# Patient Record
Sex: Male | Born: 1985
Health system: Southern US, Community
[De-identification: ages and names within clinical notes are randomized; demographics above are authoritative.]

## PROBLEM LIST (undated history)

## (undated) DIAGNOSIS — F32A Depression, unspecified: Secondary | ICD-10-CM

## (undated) DIAGNOSIS — J302 Other seasonal allergic rhinitis: Secondary | ICD-10-CM

## (undated) DIAGNOSIS — F329 Major depressive disorder, single episode, unspecified: Secondary | ICD-10-CM

## (undated) DIAGNOSIS — F988 Other specified behavioral and emotional disorders with onset usually occurring in childhood and adolescence: Secondary | ICD-10-CM

## (undated) DIAGNOSIS — M549 Dorsalgia, unspecified: Secondary | ICD-10-CM

## (undated) HISTORY — DX: Other seasonal allergic rhinitis: J30.2

## (undated) HISTORY — DX: Major depressive disorder, single episode, unspecified: F32.9

## (undated) HISTORY — DX: Other specified behavioral and emotional disorders with onset usually occurring in childhood and adolescence: F98.8

## (undated) HISTORY — PX: LACERATION REPAIR: SHX5168

## (undated) HISTORY — PX: OTHER SURGICAL HISTORY: SHX169

## (undated) HISTORY — DX: Depression, unspecified: F32.A

---
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
## 2010-09-04 ENCOUNTER — Emergency Department (HOSPITAL_COMMUNITY)
Admission: EM | Admit: 2010-09-04 | Discharge: 2010-09-04 | Disposition: A | Discharge status: Home / Self Care | Payer: BC Managed Care – PPO | Attending: Emergency Medicine | Admitting: Emergency Medicine

## 2010-09-04 DIAGNOSIS — R45851 Suicidal ideations: Secondary | ICD-10-CM | POA: Insufficient documentation

## 2010-09-04 DIAGNOSIS — F3289 Other specified depressive episodes: Secondary | ICD-10-CM | POA: Insufficient documentation

## 2010-09-04 DIAGNOSIS — F329 Major depressive disorder, single episode, unspecified: Secondary | ICD-10-CM | POA: Insufficient documentation

## 2010-09-04 LAB — COMPREHENSIVE METABOLIC PANEL
ALT: 11 U/L (ref 0–53)
AST: 12 U/L (ref 0–37)
Albumin: 4.1 g/dL (ref 3.5–5.2)
Alkaline Phosphatase: 60 U/L (ref 39–117)
BUN: 10 mg/dL (ref 6–23)
CO2: 26 mEq/L (ref 19–32)
Calcium: 9.2 mg/dL (ref 8.4–10.5)
Chloride: 106 mEq/L (ref 96–112)
Creatinine, Ser: 1 mg/dL (ref 0.50–1.35)
GFR calc Af Amer: >60 mL/min (ref >60)
GFR calc non Af Amer: >60 mL/min (ref >60)
Glucose, Bld: 101 mg/dL — ABNORMAL HIGH (ref 70–99)
Potassium: 3.2 mEq/L — ABNORMAL LOW (ref 3.5–5.1)
Sodium: 141 mEq/L (ref 135–145)
Total Bilirubin: 0.2 mg/dL — ABNORMAL LOW (ref 0.3–1.2)
Total Protein: 6.9 g/dL (ref 6.0–8.3)

## 2010-09-04 LAB — DIFFERENTIAL
Basophils Absolute: 0 10*3/uL (ref 0.0–0.1)
Basophils Relative: 0 % (ref 0–1)
Eosinophils Absolute: 0 10*3/uL (ref 0.0–0.7)
Eosinophils Relative: 0 % (ref 0–5)
Lymphocytes Relative: 15 % (ref 12–46)
Lymphs Abs: 1.9 10*3/uL (ref 0.7–4.0)
Monocytes Absolute: 0.6 10*3/uL (ref 0.1–1.0)
Monocytes Relative: 5 % (ref 3–12)
Neutro Abs: 10.3 10*3/uL — ABNORMAL HIGH (ref 1.7–7.7)
Neutrophils Relative %: 80 % — ABNORMAL HIGH (ref 43–77)

## 2010-09-04 LAB — CBC
HCT: 41.8 % (ref 39.0–52.0)
Hemoglobin: 15 g/dL (ref 13.0–17.0)
MCH: 30.1 pg (ref 26.0–34.0)
MCHC: 35.9 g/dL (ref 30.0–36.0)
MCV: 83.9 fL (ref 78.0–100.0)
Platelets: 253 10*3/uL (ref 150–400)
RBC: 4.98 MIL/uL (ref 4.22–5.81)
RDW: 11.9 % (ref 11.5–15.5)
WBC: 12.7 10*3/uL — ABNORMAL HIGH (ref 4.0–10.5)

## 2010-09-04 LAB — RAPID URINE DRUG SCREEN, HOSP PERFORMED
Barbiturates: NOT DETECTED
Cocaine: NOT DETECTED

## 2010-09-04 LAB — ETHANOL: Alcohol, Ethyl (B): <11 mg/dL (ref 0–11)

## 2010-09-04 LAB — ACETAMINOPHEN LEVEL: Acetaminophen (Tylenol), Serum: <15 ug/mL (ref 10–30)

## 2012-06-03 ENCOUNTER — Emergency Department (HOSPITAL_COMMUNITY)
Admission: EM | Admit: 2012-06-03 | Discharge: 2012-06-03 | Disposition: A | Discharge status: Home / Self Care | Payer: BC Managed Care – PPO | Attending: Emergency Medicine | Admitting: Emergency Medicine

## 2012-06-03 ENCOUNTER — Emergency Department (HOSPITAL_COMMUNITY): Payer: BC Managed Care – PPO

## 2012-06-03 ENCOUNTER — Encounter (HOSPITAL_COMMUNITY): Payer: Self-pay | Admitting: Emergency Medicine

## 2012-06-03 DIAGNOSIS — F17200 Nicotine dependence, unspecified, uncomplicated: Secondary | ICD-10-CM | POA: Insufficient documentation

## 2012-06-03 DIAGNOSIS — S81009A Unspecified open wound, unspecified knee, initial encounter: Secondary | ICD-10-CM | POA: Insufficient documentation

## 2012-06-03 DIAGNOSIS — S81012A Laceration without foreign body, left knee, initial encounter: Secondary | ICD-10-CM

## 2012-06-03 DIAGNOSIS — W010XXA Fall on same level from slipping, tripping and stumbling without subsequent striking against object, initial encounter: Secondary | ICD-10-CM | POA: Insufficient documentation

## 2012-06-03 DIAGNOSIS — Y93H9 Activity, other involving exterior property and land maintenance, building and construction: Secondary | ICD-10-CM | POA: Insufficient documentation

## 2012-06-03 DIAGNOSIS — Y929 Unspecified place or not applicable: Secondary | ICD-10-CM | POA: Insufficient documentation

## 2012-06-03 DIAGNOSIS — S81809A Unspecified open wound, unspecified lower leg, initial encounter: Secondary | ICD-10-CM | POA: Insufficient documentation

## 2012-06-03 MED ORDER — IBUPROFEN 400 MG PO TABS
800.0000 mg | ORAL_TABLET | Freq: Once | ORAL | Status: AC
  Administered 2012-06-03: 800 mg via ORAL
  Filled 2012-06-03: qty 2

## 2012-06-03 NOTE — ED Provider Notes (Signed)
History     CSN: [id number]  Arrival date & time 06/03/12  1326   First MD Initiated Contact with Patient 06/03/12 1330      Chief Complaint  Patient presents with  . Leg Injury    (Consider location/radiation/quality/duration/timing/severity/associated sxs/prior treatment) HPI Comments: Patient reports he was clearing some limbs and tripped/slipped and fell on the sidewalk sustaining a laceration to his left leg below his knee.  Reports "stinging" pain right around the laceration.  No radiation.  He and significant other state they don't know what he cut his leg on, unsure if there was metal or other debris on the sidewalk.  Occurred just prior to arrival.  Denies any weakness or numbness of the leg, difficult bending his knee.  Did not hit head, no LOC.  Tetanus vx is UTD.    The history is provided by the patient.    History reviewed. No pertinent past medical history.  History reviewed. No pertinent past surgical history.  History reviewed. No pertinent family history.  History  Substance Use Topics  . Smoking status: Current Every Day Smoker  . Smokeless tobacco: Not on file  . Alcohol Use: Yes      Review of Systems  Constitutional: Negative for fever.  Skin: Positive for wound. Negative for color change and pallor.  Neurological: Negative for syncope, weakness and numbness.    Allergies  Review of patient's allergies indicates no known allergies.  Home Medications  No current outpatient prescriptions on file.  BP 119/71  Pulse 86  Temp(Src) 97.8 F (36.6 C) (Oral)  SpO2 97%  Physical Exam  Nursing note and vitals reviewed. Constitutional: He appears well-developed and well-nourished. No distress.  HENT:  Head: Normocephalic and atraumatic.  Neck: Neck supple.  Pulmonary/Chest: Effort normal.  Musculoskeletal:       Left knee: He exhibits laceration. He exhibits normal range of motion, no swelling, no effusion, no ecchymosis, no deformity, normal  alignment, no LCL laxity and no MCL laxity. No tenderness found.       Legs: Neurological: He is alert.  Skin: He is not diaphoretic.    ED Course  Procedures (including critical care time)  Labs Reviewed - No data to display Dg Knee 2 Views Left  06/03/2012  *RADIOLOGY REPORT*  Clinical Data: Laceration post fall.  LEFT KNEE - 1-2 VIEW  Comparison: None.  Findings: No radiodense foreign body.  No effusion. Negative for fracture, dislocation, or other acute abnormality.  Normal alignment and mineralization. No significant degenerative change. Regional soft tissues unremarkable.  IMPRESSION:  Negative   Original Report Authenticated By: D. Andria Rhein, MD     LACERATION REPAIR Performed by: Rise Patience Authorized by: Rise Patience Consent: Verbal consent obtained. Risks and benefits: risks, benefits and alternatives were discussed Consent given by: patient Patient identity confirmed: provided demographic data Prepped and Draped in normal sterile fashion Wound explored  Laceration Location: left knee  Laceration Length: 3cm  No Foreign Bodies seen or palpated  Anesthesia: local infiltration  Local anesthetic: lidocaine 2% no epinephrine  Anesthetic total: 8 ml  Irrigation method: syringe Amount of cleaning: standard  Skin closure: 4-0 prolene  Number of sutures: 6  Technique: simple interrupted  Patient tolerance: Patient tolerated the procedure well with no immediate complications.    1. Laceration of left knee, initial encounter       MDM  Pt with mechanical fall onto concrete sustaining laceration to left knee.  Knee joint uninvolved.  Xray to  r/o FB.  Laceration repaired in ED.  Discussed all results, care plan, and follow up with patient.  Pt given return precautions.  Pt verbalizes understanding and agrees with plan.           Trixie Dredge, PA-C 06/03/12 1455

## 2012-06-03 NOTE — ED Notes (Signed)
Slipped on concrete

## 2012-06-03 NOTE — ED Provider Notes (Signed)
Medical screening examination/treatment/procedure(s) were performed by non-physician practitioner and as supervising physician I was immediately available for consultation/collaboration.  Luwanna Brossman, MD 06/03/12 1645 

## 2012-08-29 ENCOUNTER — Encounter (HOSPITAL_COMMUNITY): Payer: Self-pay | Admitting: *Deleted

## 2012-08-29 ENCOUNTER — Emergency Department (HOSPITAL_COMMUNITY)
Admission: EM | Admit: 2012-08-29 | Discharge: 2012-08-29 | Disposition: A | Discharge status: Home / Self Care | Payer: BC Managed Care – PPO | Attending: Emergency Medicine | Admitting: Emergency Medicine

## 2012-08-29 DIAGNOSIS — S39012A Strain of muscle, fascia and tendon of lower back, initial encounter: Secondary | ICD-10-CM

## 2012-08-29 DIAGNOSIS — Y929 Unspecified place or not applicable: Secondary | ICD-10-CM | POA: Insufficient documentation

## 2012-08-29 DIAGNOSIS — X58XXXA Exposure to other specified factors, initial encounter: Secondary | ICD-10-CM | POA: Insufficient documentation

## 2012-08-29 DIAGNOSIS — S335XXA Sprain of ligaments of lumbar spine, initial encounter: Secondary | ICD-10-CM | POA: Insufficient documentation

## 2012-08-29 DIAGNOSIS — F17200 Nicotine dependence, unspecified, uncomplicated: Secondary | ICD-10-CM | POA: Insufficient documentation

## 2012-08-29 DIAGNOSIS — Y939 Activity, unspecified: Secondary | ICD-10-CM | POA: Insufficient documentation

## 2012-08-29 MED ORDER — NAPROXEN 375 MG PO TABS
375.0000 mg | ORAL_TABLET | Freq: Two times a day (BID) | ORAL | Status: DC
Start: 2012-08-29 — End: 2013-03-30

## 2012-08-29 MED ORDER — METAXALONE 800 MG PO TABS: 800.0000 mg | ORAL_TABLET | Freq: Three times a day (TID) | ORAL | Status: DC

## 2012-08-29 NOTE — ED Provider Notes (Signed)
   History    CSN: [id number] Arrival date & time 08/29/12  0719  First MD Initiated Contact with Patient 08/29/12 [phone]     Chief Complaint  Patient presents with  . Back Pain   (Consider location/radiation/quality/duration/timing/severity/associated sxs/prior Treatment) HPI Comments: Pt present with back pain that started about 2 weeks ago.  He states that it is constant and throbbing and has just not been getting better since than.  It is across his lower back.  No abd pain, no radiation down legs.  No numbness or weakness to his legs.  No bowel or bladder incontinence.  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  Denies any particular injury that started the pain.  Has been using tylenol at home without improvement.  Tried a friend's flexeril without improvement.  Patient is a 27 y.o. male presenting with back pain.  Back Pain Associated symptoms: no abdominal pain, no dysuria, no fever, no headaches, no numbness and no weakness    History reviewed. No pertinent past medical history. History reviewed. No pertinent past surgical history. No family history on file. History  Substance Use Topics  . Smoking status: Current Every Day Smoker  . Smokeless tobacco: Not on file  . Alcohol Use: Yes    Review of Systems  Constitutional: Negative for fever.  HENT: Negative for neck pain.   Respiratory: Negative.   Cardiovascular: Negative.   Gastrointestinal: Negative for nausea, vomiting and abdominal pain.  Genitourinary: Negative.  Negative for dysuria.  Musculoskeletal: Positive for back pain. Negative for joint swelling.  Skin: Negative for wound.  Neurological: Negative for weakness, numbness and headaches.    Allergies  Review of patient's allergies indicates no known allergies.  Home Medications   Current Outpatient Rx  Name  Route  Sig  Dispense  Refill  . metaxalone (SKELAXIN) 800 MG tablet   Oral   Take 1 tablet  (800 mg total) by mouth 3 (three) times daily.   21 tablet   0   . naproxen (NAPROSYN) 375 MG tablet   Oral   Take 1 tablet (375 mg total) by mouth 2 (two) times daily.   20 tablet   0    BP 130/83  Pulse 65  Temp(Src) 98.1 F (36.7 C) (Oral)  Resp 20  SpO2 98% Physical Exam  Constitutional: He is oriented to person, place, and time. He appears well-developed and well-nourished.  HENT:  Head: Normocephalic and atraumatic.  Neck: Normal range of motion. Neck supple.  No pain to neck or back  Musculoskeletal: He exhibits no edema and no tenderness.  +tenderness across the lumbar paraspinal muscles bilaterally.  No midline tenderness.  Neg SLR bilaterally.  Patellar reflexes symmetric bilaterally.  Neurological: He is alert and oriented to person, place, and time. He has normal strength. No sensory deficit.  Skin: Skin is warm and dry.    ED Course  Procedures (including critical care time) Labs Reviewed - No data to display No results found. 1. Back strain, initial encounter     MDM  Will try naproxen, skelaxin.  Feel that this is likely musculoskeletal.  No indication for imaging now.  No signs of cauda equina.  Will d/c.  Advised to f/u with his PMD in [place] if symptoms not improving.  Rolan Bucco, MD 08/29/12 [phone]

## 2012-08-29 NOTE — ED Notes (Signed)
Pt is here with lower back pain for 1.5 weeks and thinks he injured it at work.

## 2012-08-29 NOTE — Discharge Instructions (Signed)
Back Pain, Adult Low back pain is very common. About 1 in 5 people have back pain.The cause of low back pain is rarely dangerous. The pain often gets better over time.About half of people with a sudden onset of back pain feel better in just 2 weeks. About 8 in 10 people feel better by 6 weeks.  CAUSES Some common causes of back pain include:  Strain of the muscles or ligaments supporting the spine.  Wear and tear (degeneration) of the spinal discs.  Arthritis.  Direct injury to the back. DIAGNOSIS Most of the time, the direct cause of low back pain is not known.However, back pain can be treated effectively even when the exact cause of the pain is unknown.Answering your caregiver's questions about your overall health and symptoms is one of the most accurate ways to make sure the cause of your pain is not dangerous. If your caregiver needs more information, he or she may order lab work or imaging tests (X-rays or MRIs).However, even if imaging tests show changes in your back, this usually does not require surgery. HOME CARE INSTRUCTIONS For many people, back pain returns.Since low back pain is rarely dangerous, it is often a condition that people can learn to [facility] their own.   Remain active. It is stressful on the back to sit or stand in one place. Do not sit, drive, or stand in one place for more than 30 minutes at a time. Take short walks on level surfaces as soon as pain allows.Try to increase the length of time you walk each day.  Do not stay in bed.Resting more than 1 or 2 days can delay your recovery.  Do not avoid exercise or work.Your body is made to move.It is not dangerous to be active, even though your back may hurt.Your back will likely heal faster if you return to being active before your pain is gone.  Pay attention to your body when you bend and lift. Many people have less discomfortwhen lifting if they bend their knees, keep the load close to their bodies,and  avoid twisting. Often, the most comfortable positions are those that put less stress on your recovering back.  Find a comfortable position to sleep. Use a firm mattress and lie on your side with your knees slightly bent. If you lie on your back, put a pillow under your knees.  Only take over-the-counter or prescription medicines as directed by your caregiver. Over-the-counter medicines to reduce pain and inflammation are often the most helpful.Your caregiver may prescribe muscle relaxant drugs.These medicines help dull your pain so you can more quickly return to your normal activities and healthy exercise.  Put ice on the injured area.  Put ice in a plastic bag.  Place a towel between your skin and the bag.  Leave the ice on for 15-20 minutes, 3-4 times a day for the first 2 to 3 days. After that, ice and heat may be alternated to reduce pain and spasms.  Ask your caregiver about trying back exercises and gentle massage. This may be of some benefit.  Avoid feeling anxious or stressed.Stress increases muscle tension and can worsen back pain.It is important to recognize when you are anxious or stressed and learn ways to manage it.Exercise is a great option. SEEK MEDICAL CARE IF:  You have pain that is not relieved with rest or medicine.  You have pain that does not improve in 1 week.  You have new symptoms.  You are generally not feeling well. SEEK  IMMEDIATE MEDICAL CARE IF:   You have pain that radiates from your back into your legs.  You develop new bowel or bladder control problems.  You have unusual weakness or numbness in your arms or legs.  You develop nausea or vomiting.  You develop abdominal pain.  You feel faint. Document Released: 02/15/2005 Document Revised: 08/17/2011 Document Reviewed: 07/06/2010 [facility] Patient Information 2014 [place], [place].  Cryotherapy Cryotherapy means treatment with cold. Ice or gel packs can be used to reduce both pain and  swelling. Ice is the most helpful within the first 24 to 48 hours after an injury or flareup from overusing a muscle or joint. Sprains, strains, spasms, burning pain, shooting pain, and aches can all be eased with ice. Ice can also be used when recovering from surgery. Ice is effective, has very few side effects, and is safe for most people to use. PRECAUTIONS  Ice is not a safe treatment option for people with:  Raynaud's phenomenon. This is a condition affecting small blood vessels in the extremities. Exposure to cold may cause your problems to return.  Cold hypersensitivity. There are many forms of cold hypersensitivity, including:  Cold urticaria. Red, itchy hives appear on the skin when the tissues begin to warm after being iced.  Cold erythema. This is a red, itchy rash caused by exposure to cold.  Cold hemoglobinuria. Red blood cells break down when the tissues begin to warm after being iced. The hemoglobin that carry oxygen are passed into the urine because they cannot combine with blood proteins fast enough.  Numbness or altered sensitivity in the area being iced. If you have any of the following conditions, do not use ice until you have discussed cryotherapy with your caregiver:  Heart conditions, such as arrhythmia, angina, or chronic heart disease.  High blood pressure.  Healing wounds or open skin in the area being iced.  Current infections.  Rheumatoid arthritis.  Poor circulation.  Diabetes. Ice slows the blood flow in the region it is applied. This is beneficial when trying to stop inflamed tissues from spreading irritating chemicals to surrounding tissues. However, if you expose your skin to cold temperatures for too long or without the proper protection, you can damage your skin or nerves. Watch for signs of skin damage due to cold. HOME CARE INSTRUCTIONS Follow these tips to use ice and cold packs safely.  Place a dry or damp towel between the ice and skin. A damp  towel will cool the skin more quickly, so you may need to shorten the time that the ice is used.  For a more rapid response, add gentle compression to the ice.  Ice for no more than 10 to 20 minutes at a time. The bonier the area you are icing, the less time it will take to get the benefits of ice.  Check your skin after 5 minutes to make sure there are no signs of a poor response to cold or skin damage.  Rest 20 minutes or more in between uses.  Once your skin is numb, you can end your treatment. You can test numbness by very lightly touching your skin. The touch should be so light that you do not see the skin dimple from the pressure of your fingertip. When using ice, most people will feel these normal sensations in this order: cold, burning, aching, and numbness.  Do not use ice on someone who cannot communicate their responses to pain, such as small children or people with dementia. HOW  MAKE AN ICE PACK °Ice packs are the most common way to use ice therapy. Other methods include ice massage, ice baths, and cryo-sprays. Muscle creams that cause a cold, tingly feeling do not offer the same benefits that ice offers and should not be used as a substitute unless recommended by your caregiver. °To make an ice pack, do one of the following: °· Place crushed ice or a bag of frozen vegetables in a sealable plastic bag. Squeeze out the excess air. Place this bag inside another plastic bag. Slide the bag into a pillowcase or place a damp towel between your skin and the bag. °· Mix 3 parts water with 1 part rubbing alcohol. Freeze the mixture in a sealable plastic bag. When you remove the mixture from the freezer, it will be slushy. Squeeze out the excess air. Place this bag inside another plastic bag. Slide the bag into a pillowcase or place a damp towel between your skin and the bag. °SEEK MEDICAL CARE IF: °· You develop white spots on your skin. This may give the skin a blotchy (mottled)  appearance. °· Your skin turns blue or pale. °· Your skin becomes waxy or hard. °· Your swelling gets worse. °MAKE SURE YOU:  °· Understand these instructions. °· Will watch your condition. °· Will get help right away if you are not doing well or get worse. °Document Released: 10/12/2010 Document Revised: 05/10/2011 Document Reviewed: 10/12/2010 °[employer]® Patient Information ©2014 [employer]. ° °

## 2013-01-22 ENCOUNTER — Encounter (HOSPITAL_COMMUNITY): Payer: Self-pay | Admitting: Family Medicine

## 2013-01-22 ENCOUNTER — Emergency Department (INDEPENDENT_AMBULATORY_CARE_PROVIDER_SITE_OTHER)
Admission: EM | Admit: 2013-01-22 | Discharge: 2013-01-22 | Disposition: A | Discharge status: Home / Self Care | Payer: BC Managed Care – PPO | Source: Home / Self Care

## 2013-01-22 DIAGNOSIS — M549 Dorsalgia, unspecified: Secondary | ICD-10-CM

## 2013-01-22 DIAGNOSIS — M62838 Other muscle spasm: Secondary | ICD-10-CM

## 2013-01-22 HISTORY — DX: Dorsalgia, unspecified: M54.9

## 2013-01-22 MED ORDER — MELOXICAM 15 MG PO TABS: 15.0000 mg | ORAL_TABLET | Freq: Every day | ORAL | Status: DC

## 2013-01-22 MED ORDER — TRAMADOL HCL 50 MG PO TABS: 50.0000 mg | ORAL_TABLET | Freq: Four times a day (QID) | ORAL | Status: DC | PRN

## 2013-01-22 MED ORDER — CYCLOBENZAPRINE HCL 5 MG PO TABS: 5.0000 mg | ORAL_TABLET | Freq: Three times a day (TID) | ORAL | Status: DC | PRN

## 2013-01-22 NOTE — ED Notes (Signed)
Being treated with spouse , spouse is a patient, too

## 2013-01-22 NOTE — ED Notes (Signed)
Back pain, right worse than left side of back.  , onset yesterday, history of chronic back pain.  No recent injury.  No leg pain, patient has been moving recently with a lot of lifting and patient does tree work for a living.

## 2013-01-22 NOTE — ED Provider Notes (Signed)
CSN: [id number]     Arrival date & time 01/22/13  1535 History   None    Chief Complaint  Patient presents with  . Back Pain   (Consider location/radiation/quality/duration/timing/severity/associated sxs/prior Treatment) HPI  Back pain: started yesterday. Comes and goes. Lots of heavy lifting at work. Recent move w/ lots of heavy furniture. Muscles hurt. Pulsatile. Non-radiating. Worse w/ certain movements. Advil 800mg  w/ some benefit. Denies any loss of bowel or bladder function, loss of LE strength, falls, trauma to lower back.   Past Medical History  Diagnosis Date  . Back pain    History reviewed. No pertinent past surgical history. No family history on file. History  Substance Use Topics  . Smoking status: Current Every Day Smoker  . Smokeless tobacco: Not on file  . Alcohol Use: Yes    Review of Systems  Respiratory: Negative for chest tightness and shortness of breath.   Cardiovascular: Negative for chest pain.  All other systems reviewed and are negative.    Allergies  Review of patient's allergies indicates no known allergies.  Home Medications   Current Outpatient Rx  Name  Route  Sig  Dispense  Refill  . metaxalone (SKELAXIN) 800 MG tablet   Oral   Take 1 tablet (800 mg total) by mouth 3 (three) times daily.   21 tablet   0   . naproxen (NAPROSYN) 375 MG tablet   Oral   Take 1 tablet (375 mg total) by mouth 2 (two) times daily.   20 tablet   0    BP 122/75  Pulse 81  Temp(Src) 98.3 F (36.8 C) (Oral)  Resp 20  SpO2 97% Physical Exam  Constitutional: He is oriented to person, place, and time. He appears well-developed and well-nourished. No distress.  HENT:  Head: Normocephalic and atraumatic.  Neck: Normal range of motion.  Cardiovascular: Normal rate, normal heart sounds and intact distal pulses.   Pulmonary/Chest: Effort normal and breath sounds normal.  Abdominal: Soft. He exhibits no distension.  Musculoskeletal:  R lower thoracic to  lumbar perispinal muscle tenderness and tightness. No bony abnromality. FROM.   Neurological: He is alert and oriented to person, place, and time.  Skin: Skin is warm and dry.  Psychiatric: He has a normal mood and affect. His behavior is normal. Judgment and thought content normal.    ED Course  Procedures (including critical care time) Labs Review Labs Reviewed - No data to display Imaging Review No results found.  EKG Interpretation    Date/Time:    Ventricular Rate:    PR Interval:    QRS Duration:   QT Interval:    QTC Calculation:   R Axis:     Text Interpretation:              MDM   1. Back pain   2. Muscle spasm    27yo M w/ back spasm and pain.  - Tramadol, flexeril, meloxicam - light work duty for 1 week - exercises, rest, massage, heat - precautions given and all questions answered  Shelly Flatten, MD Family Medicine PGY-3 01/22/2013, 4:38 PM     Ozella Rocks, MD 01/22/13 [phone]

## 2013-03-30 ENCOUNTER — Ambulatory Visit (INDEPENDENT_AMBULATORY_CARE_PROVIDER_SITE_OTHER): Payer: BC Managed Care – PPO | Admitting: Pulmonary Disease

## 2013-03-30 ENCOUNTER — Encounter: Payer: Self-pay | Admitting: Pulmonary Disease

## 2013-03-30 VITALS — BP 142/82 | HR 93 | Temp 98.5°F | Ht 67.0 in | Wt 189.2 lb

## 2013-03-30 DIAGNOSIS — R042 Hemoptysis: Secondary | ICD-10-CM | POA: Insufficient documentation

## 2013-03-30 NOTE — Progress Notes (Signed)
   Subjective:    Patient ID: Matthew Leblanc, male    DOB: 10/07/1985, 28 y.o.   MRN: [id number]  HPI The patient is a 28 year old male who I've been asked to see for hemoptysis. He has a long history of smoking, but was in his usual state of health until approximately 4 days ago. He noticed upon arising mucus that contained about a tablespoon of maroon-colored blood, as well as green mucus. This occurred daily in the mornings, and would sometimes be more red in color. This tapered off, and he is bringing up only "specks" at this time. The patient is unsure if this was coming from his chest or the back of his throat, but he states that he feels his palate and back of throat is coated with this abnormal mucus. He has not had any epistaxis, but does have nasal congestion and pressure. He denies any chest congestion, and doesn't feel like he has a cold. He has had a recent chest x-ray with no acute process noted.   Review of Systems  Constitutional: Negative for fever and unexpected weight change.  HENT: Negative for congestion, dental problem, ear pain, nosebleeds, postnasal drip, rhinorrhea, sinus pressure, sneezing, sore throat and trouble swallowing.   Eyes: Negative for redness and itching.  Respiratory: Positive for cough. Negative for chest tightness, shortness of breath and wheezing.        Hemoptysis  Cardiovascular: Positive for chest pain. Negative for palpitations and leg swelling.       Aching chest pain radiates to back  Gastrointestinal: Negative for nausea and vomiting.  Genitourinary: Negative for dysuria.  Musculoskeletal: Negative for joint swelling.  Skin: Negative for rash.  Neurological: Negative for headaches.  Hematological: Does not bruise/bleed easily.  Psychiatric/Behavioral: Negative for dysphoric mood. The patient is not nervous/anxious.        Objective:   Physical Exam Constitutional:  Well developed, no acute distress  HENT:  Nares patent without  discharge,mild inflammatory changes.   Oropharynx without exudate, palate and uvula are normal  Eyes:  Perrla, eomi, no scleral icterus  Neck:  No JVD, no TMG  Cardiovascular:  Normal rate, regular rhythm, no rubs or gallops.  No murmurs        Intact distal pulses  Pulmonary :  Normal breath sounds, no stridor or respiratory distress   No rales, rhonchi, or wheezing  Abdominal:  Soft, nondistended, bowel sounds present.  No tenderness noted.   Musculoskeletal:  No lower extremity edema noted.  Lymph Nodes:  No cervical lymphadenopathy noted  Skin:  No cyanosis noted  Neurologic:  Alert, appropriate, moves all 4 extremities without obvious deficit.         Assessment & Plan:

## 2013-03-30 NOTE — Assessment & Plan Note (Signed)
The patient has a history of hemoptysis with onset 4 days prior, and he also describes purulent mucus admixed with the blood. From his history, it sounds like this is coming more from his upper airway than lower. I suspect this is related to an acute sinusitis, and I have asked him to take his Augmentin as prescribed by his primary physician. If he continues to have hemoptysis, I am happy to reevaluate. Also stressed to him the importance of total smoking cessation.

## 2013-03-30 NOTE — Patient Instructions (Signed)
The most common cause of blood in mucus in your age group is a sinus infection and bronchitis.   Fill your prescription for augmentin, and take as directed.   Use nasal saline spray am and pm to clean out nose well while taking antibiotic. Stop smoking. If you do not have complete resolution of your symptoms, you need to let us know.

## 2013-05-29 ENCOUNTER — Encounter (HOSPITAL_COMMUNITY): Payer: Self-pay | Admitting: Emergency Medicine

## 2013-05-29 ENCOUNTER — Emergency Department (HOSPITAL_COMMUNITY)
Admission: EM | Admit: 2013-05-29 | Discharge: 2013-05-29 | Discharge status: Against Medical Advice | Payer: BC Managed Care – PPO | Attending: Emergency Medicine | Admitting: Emergency Medicine

## 2013-05-29 DIAGNOSIS — R197 Diarrhea, unspecified: Secondary | ICD-10-CM | POA: Insufficient documentation

## 2013-05-29 LAB — COMPREHENSIVE METABOLIC PANEL
ALBUMIN: 4.9 g/dL (ref 3.5–5.2)
ALT: 30 U/L (ref 0–53)
AST: 25 U/L (ref 0–37)
Alkaline Phosphatase: 109 U/L (ref 39–117)
BUN: 15 mg/dL (ref 6–23)
CALCIUM: 10 mg/dL (ref 8.4–10.5)
CO2: 22 mEq/L (ref 19–32)
Chloride: 99 mEq/L (ref 96–112)
Creatinine, Ser: 1.36 mg/dL — ABNORMAL HIGH (ref 0.50–1.35)
GFR calc non Af Amer: 71 mL/min — ABNORMAL LOW (ref >90)
GFR, EST AFRICAN AMERICAN: 83 mL/min — AB (ref >90)
Glucose, Bld: 96 mg/dL (ref 70–99)
Potassium: 4.1 mEq/L (ref 3.7–5.3)
SODIUM: 140 meq/L (ref 137–147)
TOTAL PROTEIN: 8.7 g/dL — AB (ref 6.0–8.3)
Total Bilirubin: 0.4 mg/dL (ref 0.3–1.2)

## 2013-05-29 LAB — CBC WITH DIFFERENTIAL/PLATELET
BASOS ABS: 0 10*3/uL (ref 0.0–0.1)
BASOS PCT: 0 % (ref 0–1)
EOS ABS: 0.3 10*3/uL (ref 0.0–0.7)
EOS PCT: 2 % (ref 0–5)
HCT: 48.7 % (ref 39.0–52.0)
Hemoglobin: 18 g/dL — ABNORMAL HIGH (ref 13.0–17.0)
Lymphocytes Relative: 7 % — ABNORMAL LOW (ref 12–46)
Lymphs Abs: 1.1 10*3/uL (ref 0.7–4.0)
MCH: 30.2 pg (ref 26.0–34.0)
MCHC: 37 g/dL — ABNORMAL HIGH (ref 30.0–36.0)
MCV: 81.6 fL (ref 78.0–100.0)
Monocytes Absolute: 1.2 10*3/uL — ABNORMAL HIGH (ref 0.1–1.0)
Monocytes Relative: 8 % (ref 3–12)
Neutro Abs: 11.9 10*3/uL — ABNORMAL HIGH (ref 1.7–7.7)
Neutrophils Relative %: 82 % — ABNORMAL HIGH (ref 43–77)
PLATELETS: 270 10*3/uL (ref 150–400)
RBC: 5.97 MIL/uL — ABNORMAL HIGH (ref 4.22–5.81)
RDW: 12.5 % (ref 11.5–15.5)
WBC: 14.5 10*3/uL — ABNORMAL HIGH (ref 4.0–10.5)

## 2013-05-29 LAB — LIPASE, BLOOD: Lipase: 17 U/L (ref 11–59)

## 2013-05-29 NOTE — ED Notes (Signed)
Pt presents to department for evaluation of diarrhea. Onset yesterday. No relief with medications at home. States several runny bowel movements today. Denies abdominal pain. No signs of distress noted.

## 2013-05-29 NOTE — ED Notes (Signed)
Pt sts that he is leaving so he can go sit with his girlfriend that is in [employer].

## 2013-12-11 ENCOUNTER — Emergency Department (HOSPITAL_COMMUNITY)
Admission: EM | Admit: 2013-12-11 | Discharge: 2013-12-11 | Disposition: A | Discharge status: Home / Self Care | Payer: BC Managed Care – PPO | Attending: Emergency Medicine | Admitting: Emergency Medicine

## 2013-12-11 ENCOUNTER — Encounter (HOSPITAL_COMMUNITY): Payer: Self-pay | Admitting: Emergency Medicine

## 2013-12-11 DIAGNOSIS — R112 Nausea with vomiting, unspecified: Secondary | ICD-10-CM | POA: Diagnosis not present

## 2013-12-11 DIAGNOSIS — R1084 Generalized abdominal pain: Secondary | ICD-10-CM | POA: Insufficient documentation

## 2013-12-11 DIAGNOSIS — R197 Diarrhea, unspecified: Secondary | ICD-10-CM | POA: Insufficient documentation

## 2013-12-11 DIAGNOSIS — Z720 Tobacco use: Secondary | ICD-10-CM | POA: Insufficient documentation

## 2013-12-11 DIAGNOSIS — F329 Major depressive disorder, single episode, unspecified: Secondary | ICD-10-CM | POA: Insufficient documentation

## 2013-12-11 DIAGNOSIS — R109 Unspecified abdominal pain: Secondary | ICD-10-CM | POA: Diagnosis present

## 2013-12-11 DIAGNOSIS — Z8709 Personal history of other diseases of the respiratory system: Secondary | ICD-10-CM | POA: Insufficient documentation

## 2013-12-11 DIAGNOSIS — Z8739 Personal history of other diseases of the musculoskeletal system and connective tissue: Secondary | ICD-10-CM | POA: Diagnosis not present

## 2013-12-11 DIAGNOSIS — Z79899 Other long term (current) drug therapy: Secondary | ICD-10-CM | POA: Diagnosis not present

## 2013-12-11 LAB — LIPASE, BLOOD: Lipase: 17 U/L (ref 11–59)

## 2013-12-11 LAB — CBC WITH DIFFERENTIAL/PLATELET
Basophils Absolute: 0 10*3/uL (ref 0.0–0.1)
Basophils Relative: 0 % (ref 0–1)
EOS ABS: 0.7 10*3/uL (ref 0.0–0.7)
Eosinophils Relative: 5 % (ref 0–5)
HCT: 47.8 % (ref 39.0–52.0)
HEMOGLOBIN: 16.8 g/dL (ref 13.0–17.0)
LYMPHS ABS: 3.1 10*3/uL (ref 0.7–4.0)
Lymphocytes Relative: 20 % (ref 12–46)
MCH: 29.3 pg (ref 26.0–34.0)
MCHC: 35.1 g/dL (ref 30.0–36.0)
MCV: 83.4 fL (ref 78.0–100.0)
MONOS PCT: 9 % (ref 3–12)
Monocytes Absolute: 1.4 10*3/uL — ABNORMAL HIGH (ref 0.1–1.0)
NEUTROS PCT: 66 % (ref 43–77)
Neutro Abs: 10.5 10*3/uL — ABNORMAL HIGH (ref 1.7–7.7)
Platelets: 285 10*3/uL (ref 150–400)
RBC: 5.73 MIL/uL (ref 4.22–5.81)
RDW: 12.5 % (ref 11.5–15.5)
WBC: 15.7 10*3/uL — ABNORMAL HIGH (ref 4.0–10.5)

## 2013-12-11 LAB — URINALYSIS, ROUTINE W REFLEX MICROSCOPIC
BILIRUBIN URINE: NEGATIVE
Glucose, UA: NEGATIVE mg/dL
HGB URINE DIPSTICK: NEGATIVE
KETONES UR: NEGATIVE mg/dL
Leukocytes, UA: NEGATIVE
Nitrite: NEGATIVE
PROTEIN: NEGATIVE mg/dL
Specific Gravity, Urine: 1.041 — ABNORMAL HIGH (ref 1.005–1.030)
UROBILINOGEN UA: 0.2 mg/dL (ref 0.0–1.0)
pH: 5 (ref 5.0–8.0)

## 2013-12-11 LAB — COMPREHENSIVE METABOLIC PANEL
ALK PHOS: 90 U/L (ref 39–117)
ALT: 24 U/L (ref 0–53)
ANION GAP: 15 (ref 5–15)
AST: 22 U/L (ref 0–37)
Albumin: 4.4 g/dL (ref 3.5–5.2)
BILIRUBIN TOTAL: 0.4 mg/dL (ref 0.3–1.2)
BUN: 15 mg/dL (ref 6–23)
CO2: 20 mEq/L (ref 19–32)
Calcium: 9.6 mg/dL (ref 8.4–10.5)
Chloride: 104 mEq/L (ref 96–112)
Creatinine, Ser: 1.17 mg/dL (ref 0.50–1.35)
GFR, EST NON AFRICAN AMERICAN: 85 mL/min — AB (ref >90)
GLUCOSE: 113 mg/dL — AB (ref 70–99)
POTASSIUM: 4.1 meq/L (ref 3.7–5.3)
Sodium: 139 mEq/L (ref 137–147)
TOTAL PROTEIN: 7.7 g/dL (ref 6.0–8.3)

## 2013-12-11 MED ORDER — ONDANSETRON 4 MG PO TBDP
4.0000 mg | ORAL_TABLET | Freq: Once | ORAL | Status: AC
  Administered 2013-12-11: 4 mg via ORAL
  Filled 2013-12-11: qty 1

## 2013-12-11 MED ORDER — PROMETHAZINE HCL 25 MG PO TABS: 25.0000 mg | ORAL_TABLET | Freq: Four times a day (QID) | ORAL | Status: AC | PRN

## 2013-12-11 NOTE — ED Provider Notes (Signed)
CSN: [id number]     Arrival date & time 12/11/13  0444 History   First MD Initiated Contact with Patient 12/11/13 [phone]     Chief Complaint  Patient presents with  . Abdominal Pain     (Consider location/radiation/quality/duration/timing/severity/associated sxs/prior Treatment) HPI Pt is a 27yo male presenting to ED with c/o diffuse abdominal cramping, n/v/d that started yesterday morning. Reports abdominal pain is intermittent, cramping in nature, 8/10 at worst, 2/10 at this time. Reports 3 episodes of vomiting since symptoms started yesterday, and 4-5 episodes of diarrhea per hour, denies blood or mucous in stool.  States symptoms started around 7AM yesterday, seemed to resolve by 4PM so he had buffalo chicken wings, symptoms woke him up again early this morning.  States he has a pregnant wife and young daughter at home he does not want to get sick. No known sick contacts or recent travel. No medications taken PTA. Denies fever or chills. No urinary symptoms. No hx of abdominal surgeries.   Past Medical History  Diagnosis Date  . Back pain   . Depression   . ADD (attention deficit disorder) without hyperactivity   . Seasonal allergies    Past Surgical History  Procedure Laterality Date  . Chin surgery      baseball accident  . Laceration repair      left knee   Family History  Problem Relation Age of Onset  . Heart disease Maternal Grandmother   . Brain cancer Paternal Grandfather   . Hyperlipidemia Maternal Grandmother   . Hypertension Mother    History  Substance Use Topics  . Smoking status: Current Every Day Smoker -- 1.00 packs/day for 8 years    Types: Cigarettes  . Smokeless tobacco: Not on file     Comment: Pt is cutting back--currently smoking 1/4 PPD  . Alcohol Use: Yes     Comment: social-rare    Review of Systems  Constitutional: Positive for appetite change. Negative for fever and chills.  Respiratory: Negative for cough and shortness of breath.    Cardiovascular: Negative for chest pain and palpitations.  Gastrointestinal: Positive for nausea, vomiting, abdominal pain ( cramping, discomfort) and diarrhea. Negative for blood in stool.  Genitourinary: Negative for dysuria, frequency, hematuria and flank pain.  Musculoskeletal: Negative for back pain and myalgias.  All other systems reviewed and are negative.     Allergies  Review of patient's allergies indicates no known allergies.  Home Medications   Prior to Admission medications   Medication Sig Start Date End Date Taking? Authorizing Provider  ALPRAZolam Prudy Feeler(XANAX) 1 MG tablet Take 1 mg by mouth once.   Yes Historical Provider, MD  promethazine (PHENERGAN) 25 MG tablet Take 1 tablet (25 mg total) by mouth every 6 (six) hours as needed for nausea or vomiting. 12/11/13   Junius FinnerErin O'Malley, PA-C   BP 109/55  Pulse 75  Temp(Src) 98 F (36.7 C) (Oral)  Resp 21  SpO2 96% Physical Exam  Nursing note and vitals reviewed. Constitutional: He appears well-developed and well-nourished.  Pt lying comfortably in exam bed, NAD. Non-toxic appearing.  HENT:  Head: Normocephalic and atraumatic.  Mouth/Throat: Oropharynx is clear and moist.  Eyes: Conjunctivae are normal. No scleral icterus.  Neck: Normal range of motion.  Cardiovascular: Normal rate, regular rhythm and normal heart sounds.   Regular rate and rhythm  Pulmonary/Chest: Effort normal and breath sounds normal. No respiratory distress. He has no wheezes. He has no rales. He exhibits no tenderness.  Lungs:CTAB, no respiratory  distress.  Abdominal: Soft. Bowel sounds are normal. He exhibits no distension and no mass. There is no tenderness. There is no rebound and no guarding.  Soft, non-distended, non-tender. No CVAT  Musculoskeletal: Normal range of motion.  Neurological: He is alert.  Skin: Skin is warm and dry.    ED Course  Procedures (including critical care time) Labs Review Labs Reviewed  CBC WITH DIFFERENTIAL -  Abnormal; Notable for the following:    WBC 15.7 (*)    Neutro Abs 10.5 (*)    Monocytes Absolute 1.4 (*)    All other components within normal limits  COMPREHENSIVE METABOLIC PANEL - Abnormal; Notable for the following:    Glucose, Bld 113 (*)    GFR calc non Af Amer 85 (*)    All other components within normal limits  URINALYSIS, ROUTINE W REFLEX MICROSCOPIC - Abnormal; Notable for the following:    Color, Urine AMBER (*)    Specific Gravity, Urine 1.041 (*)    All other components within normal limits  LIPASE, BLOOD    Imaging Review No results found.   EKG Interpretation None      MDM   Final diagnoses:  Nausea vomiting and diarrhea  Abdominal cramping   Pt c/o n/v/d, and abdominal cramping since yesterday. Pt appears well, non-toxic, afebrile. Moist mucous membranes. Abdominal exam: not concerning for surgical abdomen.  Labs: unremarkable. Not concerned for emergent process taking place, including but not limited to appendicitis, cholecystitis, SBO, testicular torsion, pyelonephritis. Do not believe further evaluation needed at this time. Pt able to keep down several ounces of PO fluids. Will discharge home with phenergan. Home care instructions provided. Return precautions provided. Pt verbalized understanding and agreement with tx plan.    Junius FinnerErin O'Malley, PA-C 12/11/13 [phone]

## 2013-12-11 NOTE — ED Notes (Signed)
Pt. reports emesis and diarrhea onset yesterday morning with generalized abdominal cramping , denies fever or chills.

## 2013-12-11 NOTE — ED Notes (Signed)
Able to keep down fluids and crackers

## 2013-12-14 NOTE — ED Provider Notes (Signed)
Medical screening examination/treatment/procedure(s) were performed by non-physician practitioner and as supervising physician I was immediately available for consultation/collaboration.   EKG Interpretation None       Vernella Niznik M Ludia Gartland, MD 12/14/13 0734 

## 2016-08-13 ENCOUNTER — Emergency Department (HOSPITAL_COMMUNITY)
Admission: EM | Admit: 2016-08-13 | Discharge: 2016-08-13 | Disposition: A | Discharge status: Home / Self Care | Payer: Self-pay | Source: Home / Self Care | Attending: Emergency Medicine | Admitting: Emergency Medicine

## 2016-08-13 ENCOUNTER — Encounter (HOSPITAL_COMMUNITY): Payer: Self-pay | Admitting: *Deleted

## 2016-08-13 ENCOUNTER — Emergency Department (HOSPITAL_COMMUNITY)
Admission: EM | Admit: 2016-08-13 | Discharge: 2016-08-13 | Disposition: A | Discharge status: Home / Self Care | Payer: Self-pay | Attending: Emergency Medicine | Admitting: Emergency Medicine

## 2016-08-13 ENCOUNTER — Encounter (HOSPITAL_COMMUNITY): Payer: Self-pay | Admitting: Emergency Medicine

## 2016-08-13 DIAGNOSIS — K029 Dental caries, unspecified: Secondary | ICD-10-CM

## 2016-08-13 DIAGNOSIS — K0889 Other specified disorders of teeth and supporting structures: Secondary | ICD-10-CM

## 2016-08-13 DIAGNOSIS — K0882 Secondary occlusal trauma: Secondary | ICD-10-CM | POA: Insufficient documentation

## 2016-08-13 DIAGNOSIS — F909 Attention-deficit hyperactivity disorder, unspecified type: Secondary | ICD-10-CM | POA: Insufficient documentation

## 2016-08-13 DIAGNOSIS — F17210 Nicotine dependence, cigarettes, uncomplicated: Secondary | ICD-10-CM | POA: Insufficient documentation

## 2016-08-13 MED ORDER — CLINDAMYCIN HCL 300 MG PO CAPS: 300.0000 mg | ORAL_CAPSULE | Freq: Four times a day (QID) | ORAL | 0 refills | Status: DC

## 2016-08-13 MED ORDER — HYDROCODONE-ACETAMINOPHEN 5-325 MG PO TABS: 1.0000 | ORAL_TABLET | Freq: Four times a day (QID) | ORAL | 0 refills | Status: DC | PRN

## 2016-08-13 NOTE — Discharge Instructions (Signed)
Continue taking the medication as prescribed.  Make sure to call Dr. Norris Crossurner's office this morning telling them you were referred to the emergency department.  They will make every effort to see you as quickly as possible

## 2016-08-13 NOTE — ED Provider Notes (Signed)
[facility]-EMERGENCY DEPT Provider Note   CSN: [id number] Arrival date & time: 08/13/16  [id number]     History   Chief Complaint Chief Complaint  Patient presents with  . Dental Pain    HPI Matthew Leblanc is a 31 y.o. male.  Is a 31 year old male who was seen earlier in the day for dental pain.  He was started on clindamycin and Norco presents back stating that the medicines are not working and is having pain.      Past Medical History:  Diagnosis Date  . ADD (attention deficit disorder) without hyperactivity   . Back pain   . Depression   . Seasonal allergies     Patient Active Problem List   Diagnosis Date Noted  . Hemoptysis 03/30/2013    Past Surgical History:  Procedure Laterality Date  . chin surgery     baseball accident  . LACERATION REPAIR     left knee       Home Medications    Prior to Admission medications   Medication Sig Start Date End Date Taking? Authorizing Provider  ALPRAZolam Prudy Feeler(XANAX) 1 MG tablet Take 1 mg by mouth once.    [provider]  clindamycin (CLEOCIN) 300 MG capsule Take 1 capsule (300 mg total) by mouth every 6 (six) hours. 08/13/16   Bethel BornGekas, Kelly Marie, PA-C  HYDROcodone-acetaminophen (NORCO/VICODIN) 5-325 MG tablet Take 1 tablet by mouth every 6 (six) hours as needed for severe pain. 08/13/16   Bethel BornGekas, Kelly Marie, PA-C  promethazine (PHENERGAN) 25 MG tablet Take 1 tablet (25 mg total) by mouth every 6 (six) hours as needed for nausea or vomiting. 12/11/13   Junius Finner'Malley, Erin, PA-C    Family History Family History  Problem Relation Age of Onset  . Heart disease Maternal Grandmother   . Hyperlipidemia Maternal Grandmother   . Brain cancer Paternal Grandfather   . Hypertension Mother     Social History Social History  Substance Use Topics  . Smoking status: Current Every Day Smoker    Packs/day: 1.00    Years: 8.00    Types: Cigarettes  . Smokeless tobacco: Never Used     Comment: Pt is cutting back--currently smoking 1/4  PPD  . Alcohol use Yes     Comment: social-rare     Allergies   Patient has no known allergies.   Review of Systems Review of Systems  HENT: Positive for dental problem.   Neurological: Negative for headaches.  All other systems reviewed and are negative.    Physical Exam Updated Vital Signs BP 127/83 (BP Location: Right Arm)   Pulse 62   Temp 98.2 F (36.8 C) (Oral)   Resp 18   Ht 5\' 7"  (1.702 m)   Wt 79.4 kg (175 lb)   SpO2 97%   BMI 27.41 kg/m   Physical Exam  Constitutional: He appears well-developed and well-nourished.  HENT:  Head: Normocephalic.  Neck: Normal range of motion.  Cardiovascular: Normal rate.   Pulmonary/Chest: Effort normal.  Musculoskeletal: Normal range of motion.  Neurological: He is alert.  Skin: Skin is warm.  Nursing note and vitals reviewed.    ED Treatments / Results  Labs (all labs ordered are listed, but only abnormal results are displayed) Labs Reviewed - No data to display  EKG  EKG Interpretation None       Radiology No results found.  Procedures Procedures (including critical care time)  Medications Ordered in ED Medications - No data to display   Initial Impression /  Assessment and Plan / ED Course  I have reviewed the triage vital signs and the nursing notes.  Pertinent labs & imaging results that were available during my care of the patient were reviewed by me and considered in my medical decision making (see chart for details).      No new symptoms.  Patient had to be awakened for examination.  Father states that this is the first time he slept in 2 days.  Father understands the discharge instructions and will make sure son follows up with dentist.  Final Clinical Impressions(s) / ED Diagnoses   Final diagnoses:  Pain, dental    New Prescriptions New Prescriptions   No medications on file     Earley Favor, NP 08/13/16 0559    Melene Plan, DO 08/13/16 1610

## 2016-08-13 NOTE — ED Triage Notes (Signed)
The pt is c/o a toothache for 2 days  He has some broken teeth. Pt was seen here on the ED early and sent home with prescriptions, pt states medications are not working.

## 2016-08-13 NOTE — Discharge Instructions (Signed)
Call Dr. Norris Crossurner's office this morning or follow up with your dentist Monday Take Ibuprofen 600mg  3-4 times a day Take pain medicine as needed Return for worsening symptoms

## 2016-08-13 NOTE — ED Triage Notes (Signed)
The pt is c/o a toothache for 2 days  He has some broken teeth last advil was one hour ago

## 2016-08-15 NOTE — ED Provider Notes (Signed)
[facility]PT Provider Note   CSN: [id number] Arrival date & time: 08/13/16  0012     History   Chief Complaint Chief Complaint  Patient presents with  . Dental Problem    HPI Matthew Leblanc is a 31 y.o. male who presents with dental pain. He states that his bottom left teeth are broken and started hurting two days ago. The pain comes and goes. He denies fever, drainage, inability to swallow, facial swelling, SOB. Nothing makes it better or worse. He has been taking Ibuprofen with minimal relief and he came here tonight because he couldn't sleep. He has an appointment with a dentist in 4 days.   HPI  Past Medical History:  Diagnosis Date  . ADD (attention deficit disorder) without hyperactivity   . Back pain   . Depression   . Seasonal allergies     Patient Active Problem List   Diagnosis Date Noted  . Hemoptysis 03/30/2013    Past Surgical History:  Procedure Laterality Date  . chin surgery     baseball accident  . LACERATION REPAIR     left knee       Home Medications    Prior to Admission medications   Medication Sig Start Date End Date Taking? Authorizing Provider  ALPRAZolam Prudy Feeler(XANAX) 1 MG tablet Take 1 mg by mouth once.    [provider]  clindamycin (CLEOCIN) 300 MG capsule Take 1 capsule (300 mg total) by mouth every 6 (six) hours. 08/13/16   Bethel BornGekas, Sargon Scouten Marie, PA-C  HYDROcodone-acetaminophen (NORCO/VICODIN) 5-325 MG tablet Take 1 tablet by mouth every 6 (six) hours as needed for severe pain. 08/13/16   Bethel BornGekas, Alcide Memoli Marie, PA-C  promethazine (PHENERGAN) 25 MG tablet Take 1 tablet (25 mg total) by mouth every 6 (six) hours as needed for nausea or vomiting. 12/11/13   Lurene ShadowPhelps, Erin O, PA-C    Family History Family History  Problem Relation Age of Onset  . Heart disease Maternal Grandmother   . Hyperlipidemia Maternal Grandmother   . Brain cancer Paternal Grandfather   . Hypertension Mother     Social History Social History  Substance  Use Topics  . Smoking status: Current Every Day Smoker    Packs/day: 1.00    Years: 8.00    Types: Cigarettes  . Smokeless tobacco: Never Used     Comment: Pt is cutting back--currently smoking 1/4 PPD  . Alcohol use Yes     Comment: social-rare     Allergies   Patient has no known allergies.   Review of Systems Review of Systems  Constitutional: Negative for fever.  HENT: Positive for dental problem. Negative for drooling, facial swelling and trouble swallowing.   Respiratory: Negative for shortness of breath.      Physical Exam Updated Vital Signs BP 131/84 (BP Location: Right Arm)   Pulse 76   Temp 98.2 F (36.8 C) (Oral)   Resp 12   Ht 5\' 7"  (1.702 m)   Wt 79.4 kg (175 lb)   SpO2 98%   BMI 27.41 kg/m   Physical Exam  Constitutional: He is oriented to person, place, and time. He appears well-developed and well-nourished. No distress.  HENT:  Head: Normocephalic and atraumatic.  Mouth/Throat: Oropharynx is clear and moist. No trismus in the jaw. Abnormal dentition. Dental caries present. No dental abscesses.    Eyes: Conjunctivae are normal. Pupils are equal, round, and reactive to light. Right eye exhibits no discharge. Left eye exhibits no discharge. No scleral icterus.  Neck: Normal range of motion.  Cardiovascular: Normal rate.   Pulmonary/Chest: Effort normal. No respiratory distress.  Abdominal: He exhibits no distension.  Neurological: He is alert and oriented to person, place, and time.  Skin: Skin is warm and dry.  Psychiatric: He has a normal mood and affect. His behavior is normal.  Nursing note and vitals reviewed.    ED Treatments / Results  Labs (all labs ordered are listed, but only abnormal results are displayed) Labs Reviewed - No data to display  EKG  EKG Interpretation None       Radiology No results found.  Procedures Procedures (including critical care time)  Medications Ordered in ED Medications - No data to  display   Initial Impression / Assessment and Plan / ED Course  I have reviewed the triage vital signs and the nursing notes.  Pertinent labs & imaging results that were available during my care of the patient were reviewed by me and considered in my medical decision making (see chart for details).  Dental pain associated with dental caries and possible dental infection. Patient is afebrile, non toxic appearing, and swallowing secretions well. I gave patient referral to dentist and stressed the importance of dental follow up for ultimate management of dental pain. I have also discussed reasons to return immediately to the ER.  Patient expresses understanding and agrees with plan. He has never been here for dental pain before and on review of [facility] he doesn't have any rx for narcotics. Will give short course of pain meds and Clindamycin.    Final Clinical Impressions(s) / ED Diagnoses   Final diagnoses:  Pain due to dental caries    New Prescriptions Discharge Medication List as of 08/13/2016  1:26 AM    START taking these medications   Details  clindamycin (CLEOCIN) 300 MG capsule Take 1 capsule (300 mg total) by mouth every 6 (six) hours., Starting Fri 08/13/2016, Print    HYDROcodone-acetaminophen (NORCO/VICODIN) 5-325 MG tablet Take 1 tablet by mouth every 6 (six) hours as needed for severe pain., Starting Fri 08/13/2016, Print         Bethel Born, PA-C 08/15/16 1140    Melene Plan, DO 08/16/16 1159

## 2017-09-11 ENCOUNTER — Emergency Department (HOSPITAL_BASED_OUTPATIENT_CLINIC_OR_DEPARTMENT_OTHER)
Admission: EM | Admit: 2017-09-11 | Discharge: 2017-09-11 | Disposition: A | Discharge status: Home / Self Care | Payer: BLUE CROSS/BLUE SHIELD | Attending: Emergency Medicine | Admitting: Emergency Medicine

## 2017-09-11 ENCOUNTER — Encounter (HOSPITAL_BASED_OUTPATIENT_CLINIC_OR_DEPARTMENT_OTHER): Payer: Self-pay | Admitting: Emergency Medicine

## 2017-09-11 ENCOUNTER — Other Ambulatory Visit: Payer: Self-pay

## 2017-09-11 DIAGNOSIS — Z79899 Other long term (current) drug therapy: Secondary | ICD-10-CM | POA: Insufficient documentation

## 2017-09-11 DIAGNOSIS — H60501 Unspecified acute noninfective otitis externa, right ear: Secondary | ICD-10-CM | POA: Insufficient documentation

## 2017-09-11 DIAGNOSIS — H9201 Otalgia, right ear: Secondary | ICD-10-CM | POA: Diagnosis present

## 2017-09-11 DIAGNOSIS — F17210 Nicotine dependence, cigarettes, uncomplicated: Secondary | ICD-10-CM | POA: Diagnosis not present

## 2017-09-11 DIAGNOSIS — H6121 Impacted cerumen, right ear: Secondary | ICD-10-CM | POA: Insufficient documentation

## 2017-09-11 MED ORDER — OFLOXACIN 0.3 % OT SOLN: 5.0000 [drp] | Freq: Two times a day (BID) | OTIC | 0 refills | Status: AC

## 2017-09-11 NOTE — Discharge Instructions (Signed)
Use eardrops as directed.  You can take Tylenol or Ibuprofen as directed for pain. You can alternate Tylenol and Ibuprofen every 4 hours. If you take Tylenol at 1pm, then you can take Ibuprofen at 5pm. Then you can take Tylenol again at 9pm.   Follow-up with your primary care doctor for further evaluation.  Return to emergency department for any worsening pain, fever, face or neck swelling or any other worsening or concerning symptoms.

## 2017-09-11 NOTE — ED Provider Notes (Signed)
[facility] EMERGENCY DEPARTMENT Provider Note   CSN: [id number] Arrival date & time: 09/11/17  1337     History   Chief Complaint Chief Complaint  Patient presents with  . Otalgia    HPI Matthew Leblanc is a 32 y.o. male who presents for evaluation of right ear pain x3 days.  Patient reports that he had been swimming prior to onset of symptoms.  He states that he feels like either something is in there or that is infected.  He states he has not stuck anything in his ear.  He reports some decreased hearing secondary to symptoms.  No ear drainage noted.  Patient denies any fevers, sore throat, nasal congestion.  The history is provided by the patient.    Past Medical History:  Diagnosis Date  . ADD (attention deficit disorder) without hyperactivity   . Back pain   . Depression   . Seasonal allergies     Patient Active Problem List   Diagnosis Date Noted  . Hemoptysis 03/30/2013    Past Surgical History:  Procedure Laterality Date  . chin surgery     baseball accident  . LACERATION REPAIR     left knee        Home Medications    Prior to Admission medications   Medication Sig Start Date End Date Taking? Authorizing Provider  ALPRAZolam Prudy Feeler(XANAX) 1 MG tablet Take 1 mg by mouth once.    [provider]  clindamycin (CLEOCIN) 300 MG capsule Take 1 capsule (300 mg total) by mouth every 6 (six) hours. 08/13/16   Bethel BornGekas, Kelly Marie, PA-C  HYDROcodone-acetaminophen (NORCO/VICODIN) 5-325 MG tablet Take 1 tablet by mouth every 6 (six) hours as needed for severe pain. 08/13/16   Bethel BornGekas, Kelly Marie, PA-C  ofloxacin (FLOXIN) 0.3 % OTIC solution Place 5 drops into the right ear 2 (two) times daily for 5 days. 09/11/17 09/16/17  Maxwell CaulLayden, Takya Vandivier A, PA-C  promethazine (PHENERGAN) 25 MG tablet Take 1 tablet (25 mg total) by mouth every 6 (six) hours as needed for nausea or vomiting. 12/11/13   Lurene ShadowPhelps, Erin O, PA-C    Family History Family History  Problem Relation  Age of Onset  . Heart disease Maternal Grandmother   . Hyperlipidemia Maternal Grandmother   . Brain cancer Paternal Grandfather   . Hypertension Mother     Social History Social History   Tobacco Use  . Smoking status: Current Every Day Smoker    Packs/day: 1.00    Years: 8.00    Pack years: 8.00    Types: Cigarettes  . Smokeless tobacco: Never Used  . Tobacco comment: Pt is cutting back--currently smoking 1/4 PPD  Substance Use Topics  . Alcohol use: Yes    Comment: social-rare  . Drug use: No     Allergies   Patient has no known allergies.   Review of Systems Review of Systems  Constitutional: Negative for fever.  HENT: Positive for ear pain. Negative for congestion, ear discharge and sore throat.      Physical Exam Updated Vital Signs BP 133/76 (BP Location: Left Arm)   Pulse 82   Temp 98.2 F (36.8 C) (Oral)   Resp 18   Ht 5\' 7"  (1.702 m)   Wt 81.6 kg (180 lb)   SpO2 100%   BMI 28.19 kg/m   Physical Exam  Constitutional: He appears well-developed and well-nourished.  HENT:  Head: Normocephalic and atraumatic.  Left TM partially visualized secondary to cerumen.  From what  can be visualized, no evidence of any erythema, bulging.  Unable to visualize right TM secondary to cerumen impaction.  Evaluation after earwax removal.  TM is able to be visualized.  Does not appear at the erythematous or bulging.  He does have some erythema and irritation noted external auditory canal as well as some pain with movement of the tragus.  Eyes: Conjunctivae and EOM are normal. Right eye exhibits no discharge. Left eye exhibits no discharge. No scleral icterus.  Pulmonary/Chest: Effort normal.  Neurological: He is alert.  Skin: Skin is warm and dry.  Psychiatric: He has a normal mood and affect. His speech is normal and behavior is normal.  Nursing note and vitals reviewed.    ED Treatments / Results  Labs (all labs ordered are listed, but only abnormal results are  displayed) Labs Reviewed - No data to display  EKG None  Radiology No results found.  Procedures Procedures (including critical care time)  Medications Ordered in ED Medications - No data to display   Initial Impression / Assessment and Plan / ED Course  I have reviewed the triage vital signs and the nursing notes.  Pertinent labs & imaging results that were available during my care of the patient were reviewed by me and considered in my medical decision making (see chart for details).     32 year old male who presents for evaluation of right ear pain x3 days.  Reports swimming prior to onset of symptoms.  No fever, sore throat, nasal congestion. Patient is afebrile, non-toxic appearing, sitting comfortably on examination table. Vital signs reviewed and stable.  On exam, left TM is partially occluded by cerumen.  What I can visualize, there appears to be no erythema, bulging.  Unable to visualize right TM has there is cerumen impaction.  We will plan to remove earwax in the ED and reassess.  Reevaluation after earwax removal.  Right TM is able to be visualized.  No erythema, bulging.  He does have some irritation and erythema noted to the external auditory canal as well as some pain with movement of the tragus.  Is concerning for acute otitis externa.  Will plan to treat accordingly.  Patient encouraged on supportive at home therapies. Patient had ample opportunity for questions and discussion. All patient's questions were answered with full understanding. Strict return precautions discussed. Patient expresses understanding and agreement to plan.   Final Clinical Impressions(s) / ED Diagnoses   Final diagnoses:  Acute otitis externa of right ear, unspecified type  Impacted cerumen of right ear    ED Discharge Orders        Ordered    ofloxacin (FLOXIN) 0.3 % OTIC solution  2 times daily     09/11/17 1446       Maxwell Caul, PA-C 09/11/17 1449    Tilden Fossa,  MD 09/14/17 1038

## 2017-09-11 NOTE — ED Triage Notes (Signed)
Patient states that he has had pain to his right ear x 2 -3 days

## 2017-09-13 ENCOUNTER — Encounter (HOSPITAL_BASED_OUTPATIENT_CLINIC_OR_DEPARTMENT_OTHER): Payer: Self-pay | Admitting: Emergency Medicine

## 2017-09-13 ENCOUNTER — Emergency Department (HOSPITAL_BASED_OUTPATIENT_CLINIC_OR_DEPARTMENT_OTHER)
Admission: EM | Admit: 2017-09-13 | Discharge: 2017-09-13 | Disposition: A | Discharge status: Home / Self Care | Payer: BLUE CROSS/BLUE SHIELD | Attending: Emergency Medicine | Admitting: Emergency Medicine

## 2017-09-13 ENCOUNTER — Other Ambulatory Visit: Payer: Self-pay

## 2017-09-13 DIAGNOSIS — K0889 Other specified disorders of teeth and supporting structures: Secondary | ICD-10-CM

## 2017-09-13 DIAGNOSIS — Z5321 Procedure and treatment not carried out due to patient leaving prior to being seen by health care provider: Secondary | ICD-10-CM | POA: Insufficient documentation

## 2017-09-13 DIAGNOSIS — K089 Disorder of teeth and supporting structures, unspecified: Secondary | ICD-10-CM | POA: Diagnosis not present

## 2017-09-13 NOTE — ED Triage Notes (Signed)
Pt c/o 10/10 right lower dental pain for the past 30 min.

## 2017-09-13 NOTE — ED Provider Notes (Signed)
Patient not in room on attempted evaluation. Apparently eloped.    Glynn Octaveancour, Josephus Harriger, MD 09/13/17 [phone]

## 2017-11-10 ENCOUNTER — Encounter (HOSPITAL_BASED_OUTPATIENT_CLINIC_OR_DEPARTMENT_OTHER): Payer: Self-pay | Admitting: Emergency Medicine

## 2017-11-10 ENCOUNTER — Emergency Department (HOSPITAL_BASED_OUTPATIENT_CLINIC_OR_DEPARTMENT_OTHER)
Admission: EM | Admit: 2017-11-10 | Discharge: 2017-11-10 | Disposition: A | Discharge status: Home / Self Care | Payer: BLUE CROSS/BLUE SHIELD | Attending: Emergency Medicine | Admitting: Emergency Medicine

## 2017-11-10 ENCOUNTER — Other Ambulatory Visit: Payer: Self-pay

## 2017-11-10 DIAGNOSIS — Z79899 Other long term (current) drug therapy: Secondary | ICD-10-CM | POA: Insufficient documentation

## 2017-11-10 DIAGNOSIS — K029 Dental caries, unspecified: Secondary | ICD-10-CM | POA: Insufficient documentation

## 2017-11-10 DIAGNOSIS — F17210 Nicotine dependence, cigarettes, uncomplicated: Secondary | ICD-10-CM | POA: Insufficient documentation

## 2017-11-10 MED ORDER — CLINDAMYCIN HCL 150 MG PO CAPS
300.0000 mg | ORAL_CAPSULE | Freq: Once | ORAL | Status: AC
  Administered 2017-11-10: 300 mg via ORAL

## 2017-11-10 MED ORDER — NAPROXEN 250 MG PO TABS: 500.0000 mg | ORAL_TABLET | Freq: Once | ORAL | Status: DC

## 2017-11-10 NOTE — ED Triage Notes (Signed)
Patient co left upper broken tooth; dental pain; nad noted.

## 2017-11-10 NOTE — ED Provider Notes (Signed)
[facility] EMERGENCY DEPARTMENT Provider Note   CSN: [id number] Arrival date & time: 11/10/17  0453     History   Chief Complaint No chief complaint on file.   HPI Matthew Leblanc is a 32 y.o. male.  The history is provided by the patient.  Dental Pain   This is a recurrent problem. The current episode started more than 1 week ago. The problem occurs constantly. The problem has not changed since onset.The pain is at a severity of 10/10. The pain is severe. Treatments tried: naproxen. The treatment provided no relief.  Could not afford to get left upper premolar removed.    Past Medical History:  Diagnosis Date  . ADD (attention deficit disorder) without hyperactivity   . Back pain   . Depression   . Seasonal allergies     Patient Active Problem List   Diagnosis Date Noted  . Hemoptysis 03/30/2013    Past Surgical History:  Procedure Laterality Date  . chin surgery     baseball accident  . LACERATION REPAIR     left knee        Home Medications    Prior to Admission medications   Medication Sig Start Date End Date Taking? Authorizing Provider  ALPRAZolam Prudy Feeler) 1 MG tablet Take 1 mg by mouth once.    [provider]  clindamycin (CLEOCIN) 300 MG capsule Take 1 capsule (300 mg total) by mouth every 6 (six) hours. 08/13/16   Bethel Born, PA-C  HYDROcodone-acetaminophen (NORCO/VICODIN) 5-325 MG tablet Take 1 tablet by mouth every 6 (six) hours as needed for severe pain. 08/13/16   Bethel Born, PA-C  promethazine (PHENERGAN) 25 MG tablet Take 1 tablet (25 mg total) by mouth every 6 (six) hours as needed for nausea or vomiting. 12/11/13   Lurene Shadow, PA-C    Family History Family History  Problem Relation Age of Onset  . Heart disease Maternal Grandmother   . Hyperlipidemia Maternal Grandmother   . Brain cancer Paternal Grandfather   . Hypertension Mother     Social History Social History   Tobacco Use  . Smoking  status: Current Every Day Smoker    Packs/day: 1.00    Years: 8.00    Pack years: 8.00    Types: Cigarettes  . Smokeless tobacco: Never Used  . Tobacco comment: Pt is cutting back--currently smoking 1/4 PPD  Substance Use Topics  . Alcohol use: Yes    Comment: social-rare  . Drug use: No     Allergies   Patient has no known allergies.   Review of Systems Review of Systems  Constitutional: Negative for fever.  HENT: Positive for dental problem.   Respiratory: Negative for shortness of breath.   Cardiovascular: Negative for chest pain.  All other systems reviewed and are negative.    Physical Exam Updated Vital Signs There were no vitals taken for this visit.  Physical Exam  Constitutional: He is oriented to person, place, and time. He appears well-developed and well-nourished. No distress.  HENT:  Head: Normocephalic and atraumatic.  Mouth/Throat: No trismus in the jaw. Dental caries present. No dental abscesses or uvula swelling. No oropharyngeal exudate.  Eyes: Pupils are equal, round, and reactive to light. Conjunctivae are normal.  Neck: Normal range of motion. Neck supple. No tracheal deviation present.  Cardiovascular: Normal rate, regular rhythm, normal heart sounds and intact distal pulses.  Pulmonary/Chest: Effort normal and breath sounds normal. No stridor. He has no wheezes.  Abdominal:  Soft. Bowel sounds are normal. There is no tenderness.  Musculoskeletal: Normal range of motion.  Lymphadenopathy:    He has no cervical adenopathy.  Neurological: He is alert and oriented to person, place, and time.  Skin: Skin is warm and dry. Capillary refill takes less than 2 seconds.  Psychiatric: He has a normal mood and affect.     ED Treatments / Results  Labs (all labs ordered are listed, but only abnormal results are displayed) Labs Reviewed - No data to display  EKG None  Radiology No results found.  Procedures Procedures (including critical care  time)  Medications Ordered in ED Medications - No data to display     Final Clinical Impressions(s) / ED Diagnoses   Clindamycin and Ibuprofen and dental resource guide given.  Return for fevers >100.4 unrelieved by medication, shortness of breath, intractable vomiting, or diarrhea, Inability to tolerate liquids or food, cough, altered mental status or any concerns. No signs of systemic illness or infection. The patient is nontoxic-appearing on exam and vital signs are within normal limits.   I have reviewed the triage vital signs and the nursing notes. Pertinent labs &imaging results that were available during my care of the patient were reviewed by me and considered in my medical decision making (see chart for details).  After history, exam, and medical workup I feel the patient has been appropriately medically screened and is safe for discharge home. Pertinent diagnoses were discussed with the patient. Patient was given return precautions.   Hyacinth Marcelli, MD 11/10/17 [phone]

## 2017-12-13 ENCOUNTER — Emergency Department (HOSPITAL_COMMUNITY): Admission: EM | Admit: 2017-12-13 | Payer: Self-pay | Source: Home / Self Care

## 2017-12-13 NOTE — ED Notes (Signed)
Pt called x 2 for triage with no answer 

## 2017-12-13 NOTE — ED Notes (Signed)
Pt called x 3 with no answer for triage

## 2018-03-13 ENCOUNTER — Encounter (HOSPITAL_BASED_OUTPATIENT_CLINIC_OR_DEPARTMENT_OTHER): Payer: Self-pay | Admitting: Emergency Medicine

## 2018-03-13 ENCOUNTER — Other Ambulatory Visit: Payer: Self-pay

## 2018-03-13 ENCOUNTER — Emergency Department (HOSPITAL_BASED_OUTPATIENT_CLINIC_OR_DEPARTMENT_OTHER)
Admission: EM | Admit: 2018-03-13 | Discharge: 2018-03-13 | Disposition: A | Discharge status: Home / Self Care | Payer: 59 | Attending: Emergency Medicine | Admitting: Emergency Medicine

## 2018-03-13 DIAGNOSIS — K625 Hemorrhage of anus and rectum: Secondary | ICD-10-CM | POA: Insufficient documentation

## 2018-03-13 DIAGNOSIS — F17210 Nicotine dependence, cigarettes, uncomplicated: Secondary | ICD-10-CM | POA: Diagnosis not present

## 2018-03-13 DIAGNOSIS — F909 Attention-deficit hyperactivity disorder, unspecified type: Secondary | ICD-10-CM | POA: Insufficient documentation

## 2018-03-13 DIAGNOSIS — Z79899 Other long term (current) drug therapy: Secondary | ICD-10-CM | POA: Insufficient documentation

## 2018-03-13 LAB — CBC WITH DIFFERENTIAL/PLATELET
Abs Immature Granulocytes: 0.03 10*3/uL (ref 0.00–0.07)
Abs Immature Granulocytes: 0.04 10*3/uL (ref 0.00–0.07)
Basophils Absolute: 0 10*3/uL (ref 0.0–0.1)
Basophils Absolute: 0 10*3/uL (ref 0.0–0.1)
Basophils Relative: 0 %
Basophils Relative: 0 %
Eosinophils Absolute: 0.3 10*3/uL (ref 0.0–0.5)
Eosinophils Absolute: 0.4 10*3/uL (ref 0.0–0.5)
Eosinophils Relative: 2 %
Eosinophils Relative: 3 %
HCT: 44.6 % (ref 39.0–52.0)
HCT: 42.9 % (ref 39.0–52.0)
Hemoglobin: 14.9 g/dL (ref 13.0–17.0)
Hemoglobin: 14.4 g/dL (ref 13.0–17.0)
Immature Granulocytes: 0 %
Immature Granulocytes: 0 %
Lymphocytes Relative: 20 %
Lymphocytes Relative: 22 %
Lymphs Abs: 2.6 10*3/uL (ref 0.7–4.0)
Lymphs Abs: 2.8 10*3/uL (ref 0.7–4.0)
MCH: 28.8 pg (ref 26.0–34.0)
MCH: 28.8 pg (ref 26.0–34.0)
MCHC: 33.4 g/dL (ref 30.0–36.0)
MCHC: 33.6 g/dL (ref 30.0–36.0)
MCV: 86.1 fL (ref 80.0–100.0)
MCV: 85.8 fL (ref 80.0–100.0)
Monocytes Absolute: 0.6 10*3/uL (ref 0.1–1.0)
Monocytes Absolute: 0.6 10*3/uL (ref 0.1–1.0)
Monocytes Relative: 5 %
Monocytes Relative: 5 %
Neutro Abs: 9.5 10*3/uL — ABNORMAL HIGH (ref 1.7–7.7)
Neutro Abs: 8.9 10*3/uL — ABNORMAL HIGH (ref 1.7–7.7)
Neutrophils Relative %: 73 %
Neutrophils Relative %: 70 %
Platelets: 304 10*3/uL (ref 150–400)
Platelets: 307 10*3/uL (ref 150–400)
RBC: 5.18 MIL/uL (ref 4.22–5.81)
RBC: 5 MIL/uL (ref 4.22–5.81)
RDW: 11.8 % (ref 11.5–15.5)
RDW: 11.9 % (ref 11.5–15.5)
WBC: 13.2 10*3/uL — ABNORMAL HIGH (ref 4.0–10.5)
WBC: 12.8 10*3/uL — ABNORMAL HIGH (ref 4.0–10.5)
nRBC: 0 % (ref 0.0–0.2)
nRBC: 0 % (ref 0.0–0.2)

## 2018-03-13 LAB — COMPREHENSIVE METABOLIC PANEL
ALT: 27 U/L (ref 0–44)
AST: 21 U/L (ref 15–41)
Albumin: 4.6 g/dL (ref 3.5–5.0)
Alkaline Phosphatase: 55 U/L (ref 38–126)
Anion gap: 7 (ref 5–15)
BUN: 11 mg/dL (ref 6–20)
CO2: 23 mmol/L (ref 22–32)
Calcium: 9.3 mg/dL (ref 8.9–10.3)
Chloride: 108 mmol/L (ref 98–111)
Creatinine, Ser: 1.02 mg/dL (ref 0.61–1.24)
GFR calc Af Amer: >60 mL/min (ref >60)
GFR calc non Af Amer: >60 mL/min (ref >60)
Glucose, Bld: 97 mg/dL (ref 70–99)
Potassium: 4 mmol/L (ref 3.5–5.1)
Sodium: 138 mmol/L (ref 135–145)
Total Bilirubin: 0.7 mg/dL (ref 0.3–1.2)
Total Protein: 7.2 g/dL (ref 6.5–8.1)

## 2018-03-13 LAB — OCCULT BLOOD X 1 CARD TO LAB, STOOL: Fecal Occult Bld: POSITIVE — AB

## 2018-03-13 LAB — LIPASE, BLOOD: Lipase: 21 U/L (ref 11–51)

## 2018-03-13 MED ORDER — AMOXICILLIN-POT CLAVULANATE 875-125 MG PO TABS: 1.0000 | ORAL_TABLET | Freq: Two times a day (BID) | ORAL | 0 refills | Status: DC

## 2018-03-13 MED ORDER — SUCRALFATE 1 GM/10ML PO SUSP
1.0000 g | Freq: Three times a day (TID) | ORAL | Status: DC
  Administered 2018-03-13: 1 g via ORAL
  Filled 2018-03-13: qty 10

## 2018-03-13 MED ORDER — ALUM & MAG HYDROXIDE-SIMETH 200-200-20 MG/5ML PO SUSP
30.0000 mL | Freq: Once | ORAL | Status: AC
  Administered 2018-03-13: 30 mL via ORAL
  Filled 2018-03-13: qty 30

## 2018-03-13 MED ORDER — SUCRALFATE 1 GM/10ML PO SUSP: 1.0000 g | Freq: Three times a day (TID) | ORAL | 0 refills | Status: DC

## 2018-03-13 MED ORDER — PANTOPRAZOLE SODIUM 40 MG IV SOLR
40.0000 mg | Freq: Once | INTRAVENOUS | Status: AC
  Administered 2018-03-13: 40 mg via INTRAVENOUS
  Filled 2018-03-13: qty 40

## 2018-03-13 MED ORDER — OMEPRAZOLE 40 MG PO CPDR: 40.0000 mg | DELAYED_RELEASE_CAPSULE | Freq: Every day | ORAL | 0 refills | Status: DC

## 2018-03-13 MED FILL — CARAFATE 1 GM/10 ML SUSP: 1 | 11 days supply | Qty: 420 | Fill #0

## 2018-03-13 MED FILL — OMEPRAZOLE 40 MG CPDR: 40 | 30 days supply | Qty: 30 | Fill #0

## 2018-03-13 MED FILL — AMOX-CLAV 875-125 MG TABLET: 875-125 | 7 days supply | Qty: 14 | Fill #0

## 2018-03-13 NOTE — ED Notes (Signed)
ED Provider at bedside. 

## 2018-03-13 NOTE — Discharge Instructions (Addendum)
Take Prilosec once daily for 4 weeks.  Take before meal.  Take Carafate before meals as well as at bedtime.  Take Augmentin twice daily for possible dental infection.  Please follow-up with a dentist as soon as possible.  Please follow-up with gastroenterologist as soon as possible.  Avoid NSAIDs completely.  Please return to the emergency department you develop any new or worsening symptoms.  You may need to establish care with a primary care provider to see a gastroenterologist.  You can find a PCP by calling the number circled on your paperwork.

## 2018-03-13 NOTE — ED Triage Notes (Signed)
Reports 1 episode of rectal bleeding today.  Currently c/o dental pain and states he has taken too much naproxen and believes he has caused an ulcer.  Describes the blood as bright red.

## 2018-03-13 NOTE — ED Provider Notes (Signed)
[facility] EMERGENCY DEPARTMENT Provider Note   CSN: [id number] Arrival date & time: 03/13/18  1420     History   Chief Complaint Chief Complaint  Patient presents with  . Rectal Bleeding    HPI Matthew Leblanc is a 33 y.o. male with history of ADD, depression who presents with a 1 day history of rectal bleeding and abdominal pain.  Patient describes his abdominal pain is intermittent cramping, but worse in his epigastric area.  He denies any nausea or vomiting.  He denies any fevers, chest pain, shortness of breath.  He reports he has been taking a lot of ibuprofen and Naprosyn for his dental pain.  He reports he has been taking 1000 milligrams ibuprofen at a time in addition to Naprosyn.  He has been taking Tylenol as prescribed over-the-counter.  He does not currently have a dentist, but states he saw a dentist in [place] last year.  HPI  Past Medical History:  Diagnosis Date  . ADD (attention deficit disorder) without hyperactivity   . Back pain   . Depression   . Seasonal allergies     Patient Active Problem List   Diagnosis Date Noted  . Hemoptysis 03/30/2013    Past Surgical History:  Procedure Laterality Date  . chin surgery     baseball accident  . LACERATION REPAIR     left knee        Home Medications    Prior to Admission medications   Medication Sig Start Date End Date Taking? Authorizing Provider  ALPRAZolam Prudy Feeler(XANAX) 1 MG tablet Take 1 mg by mouth once.    [provider]  amoxicillin-clavulanate (AUGMENTIN) 875-125 MG tablet Take 1 tablet by mouth every 12 (twelve) hours. 03/13/18   Apolinar Bero, Waylan BogaAlexandra M, PA-C  clindamycin (CLEOCIN) 300 MG capsule Take 1 capsule (300 mg total) by mouth every 6 (six) hours. 08/13/16   Bethel BornGekas, Kelly Marie, PA-C  HYDROcodone-acetaminophen (NORCO/VICODIN) 5-325 MG tablet Take 1 tablet by mouth every 6 (six) hours as needed for severe pain. 08/13/16   Bethel BornGekas, Kelly Marie, PA-C  omeprazole (PRILOSEC) 40 MG capsule  Take 1 capsule (40 mg total) by mouth daily for 30 days. 03/13/18 04/12/18  Saeed Toren, Waylan BogaAlexandra M, PA-C  promethazine (PHENERGAN) 25 MG tablet Take 1 tablet (25 mg total) by mouth every 6 (six) hours as needed for nausea or vomiting. 12/11/13   Lurene ShadowPhelps, Erin O, PA-C  sucralfate (CARAFATE) 1 GM/10ML suspension Take 10 mLs (1 g total) by mouth 4 (four) times daily -  with meals and at bedtime. 03/13/18   Emi HolesLaw, Angad Nabers M, PA-C    Family History Family History  Problem Relation Age of Onset  . Heart disease Maternal Grandmother   . Hyperlipidemia Maternal Grandmother   . Brain cancer Paternal Grandfather   . Hypertension Mother     Social History Social History   Tobacco Use  . Smoking status: Current Every Day Smoker    Packs/day: 1.00    Years: 8.00    Pack years: 8.00    Types: Cigarettes  . Smokeless tobacco: Never Used  . Tobacco comment: Pt is cutting back--currently smoking 1/4 PPD  Substance Use Topics  . Alcohol use: Yes    Comment: social-rare  . Drug use: No     Allergies   Penicillins   Review of Systems Review of Systems  Constitutional: Negative for chills and fever.  HENT: Positive for dental problem. Negative for facial swelling and sore throat.   Respiratory: Negative for  shortness of breath.   Cardiovascular: Negative for chest pain.  Gastrointestinal: Positive for blood in stool and diarrhea. Negative for abdominal pain, nausea and vomiting.  Genitourinary: Negative for dysuria.  Musculoskeletal: Negative for back pain.  Skin: Negative for rash and wound.  Neurological: Negative for headaches.  Psychiatric/Behavioral: The patient is not nervous/anxious.      Physical Exam Updated Vital Signs BP 132/86 (BP Location: Left Arm)   Pulse 75   Temp 98.5 F (36.9 C) (Oral)   Resp 18   Ht 5\' 7"  (1.702 m)   Wt 81.6 kg   SpO2 98%   BMI 28.19 kg/m   Physical Exam Vitals signs and nursing note reviewed. Exam conducted with a chaperone present.    Constitutional:      General: He is not in acute distress.    Appearance: He is well-developed. He is not diaphoretic.  HENT:     Head: Normocephalic and atraumatic.     Mouth/Throat:     Pharynx: No oropharyngeal exudate.  Eyes:     General: No scleral icterus.       Right eye: No discharge.        Left eye: No discharge.     Conjunctiva/sclera: Conjunctivae normal.     Pupils: Pupils are equal, round, and reactive to light.  Neck:     Musculoskeletal: Normal range of motion and neck supple.     Thyroid: No thyromegaly.  Cardiovascular:     Rate and Rhythm: Normal rate and regular rhythm.     Heart sounds: Normal heart sounds. No murmur. No friction rub. No gallop.   Pulmonary:     Effort: Pulmonary effort is normal. No respiratory distress.     Breath sounds: Normal breath sounds. No stridor. No wheezing or rales.  Abdominal:     General: Bowel sounds are normal. There is no distension.     Palpations: Abdomen is soft.     Tenderness: There is abdominal tenderness in the epigastric area. There is no guarding or rebound. Negative signs include Murphy's sign and McBurney's sign.  Genitourinary:    Rectum: Guaiac result positive (gross blood). No external hemorrhoid or internal hemorrhoid.  Lymphadenopathy:     Cervical: No cervical adenopathy.  Skin:    General: Skin is warm and dry.     Coloration: Skin is not pale.     Findings: No rash.  Neurological:     Mental Status: He is alert.     Coordination: Coordination normal.      ED Treatments / Results  Labs (all labs ordered are listed, but only abnormal results are displayed) Labs Reviewed  CBC WITH DIFFERENTIAL/PLATELET - Abnormal; Notable for the following components:      Result Value   WBC 13.2 (*)    Neutro Abs 9.5 (*)    All other components within normal limits  OCCULT BLOOD X 1 CARD TO LAB, STOOL - Abnormal; Notable for the following components:   Fecal Occult Bld POSITIVE (*)    All other components  within normal limits  CBC WITH DIFFERENTIAL/PLATELET - Abnormal; Notable for the following components:   WBC 12.8 (*)    Neutro Abs 8.9 (*)    All other components within normal limits  COMPREHENSIVE METABOLIC PANEL  LIPASE, BLOOD    EKG None  Radiology No results found.  Procedures Procedures (including critical care time)  Medications Ordered in ED Medications  sucralfate (CARAFATE) 1 GM/10ML suspension 1 g (1 g Oral Given 03/13/18  1532)  pantoprazole (PROTONIX) injection 40 mg (40 mg Intravenous Given 03/13/18 1534)  alum & mag hydroxide-simeth (MAALOX/MYLANTA) 200-200-20 MG/5ML suspension 30 mL (30 mLs Oral Given 03/13/18 1533)     Initial Impression / Assessment and Plan / ED Course  I have reviewed the triage vital signs and the nursing notes.  Pertinent labs & imaging results that were available during my care of the patient were reviewed by me and considered in my medical decision making (see chart for details).     Patient presenting with rectal bleeding.  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  Hemoglobin is stable as well as an hour repeat hemoglobin.  Patient had some improvement with Protonix, Carafate, Maalox.  Patient will be discharged home with Protonix, Carafate.  He is advised to stop NSAIDs completely.  He will be discharged home with amoxicillin for his tooth and referral to dentist.  He will also be referred to gastroenterology for further evaluation.  Return precautions discussed.  Patient understands and agrees with plan.  Patient vitals stable throughout ED course and discharged in satisfactory condition. I discussed patient case with Dr. Rubin PayorPickering who guided the patient's management and agrees with plan.   Final Clinical Impressions(s) / ED Diagnoses   Final diagnoses:  Rectal bleeding    ED Discharge Orders         Ordered    amoxicillin-clavulanate (AUGMENTIN) 875-125 MG tablet  Every 12 hours     03/13/18  1712    omeprazole (PRILOSEC) 40 MG capsule  Daily     03/13/18 1712    sucralfate (CARAFATE) 1 GM/10ML suspension  3 times daily with meals & bedtime     03/13/18 1712    Ambulatory referral to Gastroenterology     03/13/18 1714           Emi HolesLaw, Luchiano Viscomi M, PA-C 03/13/18 1715    Benjiman CorePickering, Nathan, MD 03/14/18 0013

## 2018-03-14 ENCOUNTER — Encounter: Payer: Self-pay | Admitting: Gastroenterology

## 2018-03-22 ENCOUNTER — Ambulatory Visit: Payer: 59 | Admitting: Gastroenterology

## 2018-04-21 ENCOUNTER — Ambulatory Visit: Payer: 59 | Admitting: Gastroenterology

## 2018-04-25 ENCOUNTER — Encounter: Payer: Self-pay | Admitting: Emergency Medicine

## 2018-06-05 ENCOUNTER — Emergency Department (HOSPITAL_COMMUNITY): Payer: 59

## 2018-06-05 ENCOUNTER — Emergency Department (HOSPITAL_COMMUNITY)
Admission: EM | Admit: 2018-06-05 | Discharge: 2018-06-05 | Disposition: A | Discharge status: Home / Self Care | Payer: 59 | Attending: Emergency Medicine | Admitting: Emergency Medicine

## 2018-06-05 ENCOUNTER — Encounter (HOSPITAL_COMMUNITY): Payer: Self-pay

## 2018-06-05 DIAGNOSIS — Z79899 Other long term (current) drug therapy: Secondary | ICD-10-CM | POA: Insufficient documentation

## 2018-06-05 DIAGNOSIS — Y929 Unspecified place or not applicable: Secondary | ICD-10-CM | POA: Insufficient documentation

## 2018-06-05 DIAGNOSIS — Y999 Unspecified external cause status: Secondary | ICD-10-CM | POA: Diagnosis not present

## 2018-06-05 DIAGNOSIS — Z23 Encounter for immunization: Secondary | ICD-10-CM | POA: Insufficient documentation

## 2018-06-05 DIAGNOSIS — W228XXA Striking against or struck by other objects, initial encounter: Secondary | ICD-10-CM | POA: Insufficient documentation

## 2018-06-05 DIAGNOSIS — F17210 Nicotine dependence, cigarettes, uncomplicated: Secondary | ICD-10-CM | POA: Insufficient documentation

## 2018-06-05 DIAGNOSIS — S61411A Laceration without foreign body of right hand, initial encounter: Secondary | ICD-10-CM

## 2018-06-05 DIAGNOSIS — Y93E9 Activity, other interior property and clothing maintenance: Secondary | ICD-10-CM | POA: Insufficient documentation

## 2018-06-05 MED ORDER — TETANUS-DIPHTH-ACELL PERTUSSIS 5-2.5-18.5 LF-MCG/0.5 IM SUSP
0.5000 mL | Freq: Once | INTRAMUSCULAR | Status: AC
  Administered 2018-06-05: 0.5 mL via INTRAMUSCULAR
  Filled 2018-06-05: qty 0.5

## 2018-06-05 MED ORDER — CEPHALEXIN 500 MG PO CAPS: 500.0000 mg | ORAL_CAPSULE | Freq: Four times a day (QID) | ORAL | 0 refills | Status: AC

## 2018-06-05 MED ORDER — BACITRACIN ZINC 500 UNIT/GM EX OINT
TOPICAL_OINTMENT | Freq: Two times a day (BID) | CUTANEOUS | Status: AC
  Administered 2018-06-05: 1 via TOPICAL
  Filled 2018-06-05: qty 0.9

## 2018-06-05 NOTE — ED Provider Notes (Signed)
[facility]-EMERGENCY DEPT Provider Note   CSN: [id number] Arrival date & time: 06/05/18  1006    History   Chief Complaint Chief Complaint  Patient presents with  . Extremity Laceration    HPI Matthew Leblanc is a 33 y.o. male.     33yo male presents with laceration to the palm of the right hand. 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 Patient is left hand dominant, last td approx 10 years ago. Able to move all fingers, does not believe there are any retained foreign bodies, bleeding controlled. No other injuries or concerns.      Past Medical History:  Diagnosis Date  . ADD (attention deficit disorder) without hyperactivity   . Back pain   . Depression   . Seasonal allergies     Patient Active Problem List   Diagnosis Date Noted  . Hemoptysis 03/30/2013    Past Surgical History:  Procedure Laterality Date  . chin surgery     baseball accident  . LACERATION REPAIR     left knee        Home Medications    Prior to Admission medications   Medication Sig Start Date End Date Taking? Authorizing Provider  acetaminophen (TYLENOL) 500 MG tablet Take 500 mg by mouth every 6 (six) hours as needed for mild pain or headache.   Yes [provider]  ARIPiprazole (ABILIFY) 10 MG tablet Take 20 mg by mouth daily.   Yes [provider]  ibuprofen (ADVIL,MOTRIN) 800 MG tablet Take 800 mg by mouth every 8 (eight) hours as needed for headache or moderate pain.   Yes [provider]  naproxen (NAPROSYN) 250 MG tablet Take 500 mg by mouth 2 (two) times daily as needed for mild pain.   Yes [provider]  neomycin-bacitracin-polymyxin (NEOSPORIN) ointment Apply 1 application topically every 12 (twelve) hours as needed for  wound care.   Yes [provider]  cephALEXin (KEFLEX) 500 MG capsule Take 1 capsule (500 mg total) by mouth 4 (four) times daily for 7 days. 06/05/18 06/12/18  Jeannie Fend, PA-C  promethazine (PHENERGAN) 25 MG tablet Take 1 tablet (25 mg total) by mouth every 6 (six) hours as needed for nausea or vomiting. Patient not taking: Reported on 06/05/2018 12/11/13   Rolla Plate    Family History Family History  Problem Relation Age of Onset  . Heart disease Maternal Grandmother   . Hyperlipidemia Maternal Grandmother   . Brain cancer Paternal Grandfather   . Hypertension Mother     Social History Social History   Tobacco Use  . Smoking status: Current Every Day Smoker    Packs/day: 1.00    Years: 8.00    Pack years: 8.00    Types: Cigarettes  . Smokeless tobacco: Never Used  . Tobacco comment: Pt is cutting back--currently smoking 1/4 PPD  Substance Use Topics  . Alcohol use: Yes    Comment: social-rare  . Drug use: No     Allergies   Patient has no active allergies.   Review of Systems Review of Systems  Constitutional: Negative for fever.  Musculoskeletal: Positive for myalgias. Negative for arthralgias and joint swelling.  Skin: Positive for wound. Negative for color change and rash.  Allergic/Immunologic: Negative for  immunocompromised state.  Neurological: Negative for weakness and numbness.  Hematological: Does not bruise/bleed easily.  Psychiatric/Behavioral: Negative for self-injury.  All other systems reviewed and are negative.    Physical Exam Updated Vital Signs BP (!) 143/79 (BP Location: Left Arm)   Pulse 86   Temp 98.6 F (37 C) (Oral)   Resp 16   SpO2 97%   Physical Exam Vitals signs and nursing note reviewed.  Constitutional:      General: He is not in acute distress.    Appearance: He is well-developed. He is not diaphoretic.  HENT:     Head: Normocephalic and atraumatic.  Cardiovascular:     Pulses: Normal pulses.   Pulmonary:     Effort: Pulmonary effort is normal.  Musculoskeletal:        General: Swelling, tenderness and signs of injury present. No deformity.     Right hand: He exhibits tenderness, laceration and swelling. He exhibits normal range of motion, normal capillary refill and no deformity. Normal sensation noted. Normal strength noted.       Hands:  Skin:    General: Skin is warm and dry.     Findings: No erythema.  Neurological:     Mental Status: He is alert and oriented to person, place, and time.  Psychiatric:        Behavior: Behavior normal.      ED Treatments / Results  Labs (all labs ordered are listed, but only abnormal results are displayed) Labs Reviewed - No data to display  EKG None  Radiology Dg Hand Complete Right  Result Date: 06/05/2018 CLINICAL DATA:  Foreign body inserted pt's hand anteriorly across the palm. Foreign body was a Chief Operating Officer, and patient was able to retrieve all of it as far as he is aware. Appears that the wrench went in just under the skin. EXAM: RIGHT HAND - COMPLETE 3+ VIEW COMPARISON:  None. FINDINGS: No acute fracture. Deformity of the fifth metacarpal is consistent with an old, healed fracture. No bone lesion. Joints normally spaced and aligned.  No arthropathic changes. No radiopaque foreign body.  No soft tissue air. IMPRESSION: 1. No fracture, dislocation or radiopaque foreign body. Electronically Signed   By: Amie Portland M.D.   On: 06/05/2018 11:31    Procedures Procedures (including critical care time)  Medications Ordered in ED Medications  bacitracin ointment (has no administration in time range)  Tdap (BOOSTRIX) injection 0.5 mL (0.5 mLs Intramuscular Given 06/05/18 1032)     Initial Impression / Assessment and Plan / ED Course  I have reviewed the triage vital signs and the nursing notes.  Pertinent labs & imaging results that were available during my care of the patient were reviewed by me and considered in my  medical decision making (see chart for details).  Clinical Course as of Jun 04 1145  Mon Jun 05, 2018  6945 34 year old male presents with wounds to the palm of his right hand which occurred at 9 PM last night.  Patient is left-hand dominant.  Patient is full range of motion of all fingers with normal sensation and capillary refill.  Wound visualized the depth, does not appear to involve any of the tendons.  X-rays negative for fracture or retained foreign body.  Wound was irrigated by nursing staff, dressed with bacitracin and bulky dressing.  Patient be discharged with prescription for Keflex and recommend wound check in 2 days with PCP or urgent care.  Given strict return to ER precautions, should  he develop worsening pain, redness, streaking or drainage from the wound he should return to the emergency room for further evaluation.  Patient verbalizes understanding of discharge instructions and plan.  Tetanus updated in the emergency room today.   [LM]    Clinical Course User Index [LM] Jeannie Fend, PA-C      Final Clinical Impressions(s) / ED Diagnoses   Final diagnoses:  Laceration of right hand without foreign body, initial encounter    ED Discharge Orders         Ordered    cephALEXin (KEFLEX) 500 MG capsule  4 times daily     06/05/18 1144           Jeannie Fend, PA-C 06/05/18 1147    Margarita Grizzle, MD 06/09/18 1352

## 2018-06-05 NOTE — ED Notes (Signed)
Discharge paperwork reviewed with pt.  Pts wound dressed with bacitracin, bulky abd pad and wrapped with ace bandage.  Pt ambulatory at discharge.

## 2018-06-05 NOTE — ED Notes (Signed)
X-Ray at bedside.

## 2018-06-05 NOTE — Discharge Instructions (Addendum)
Clean with mild soap, no peroxide. Recommend wound check with PCP or urgent care in 2 days, return to ER for worsening pain, drainage, redness/streaking. Take Keflex as prescribed and complete the full course.

## 2018-06-05 NOTE — ED Triage Notes (Addendum)
Patient arrived via POV.   C/O laceration to right palm of hand.   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  Kennedy Bucker broke off in patients hand.   Last tetanus about 10 years ago.   A/Ox4 Ambulatory in triage.

## 2018-07-27 ENCOUNTER — Encounter (HOSPITAL_COMMUNITY): Payer: Self-pay | Admitting: *Deleted

## 2018-07-27 ENCOUNTER — Other Ambulatory Visit: Payer: Self-pay

## 2018-07-27 ENCOUNTER — Emergency Department (HOSPITAL_COMMUNITY)
Admission: EM | Admit: 2018-07-27 | Discharge: 2018-07-27 | Disposition: A | Discharge status: Home / Self Care | Payer: Managed Care, Other (non HMO) | Attending: Emergency Medicine | Admitting: Emergency Medicine

## 2018-07-27 DIAGNOSIS — H9201 Otalgia, right ear: Secondary | ICD-10-CM | POA: Diagnosis present

## 2018-07-27 DIAGNOSIS — Z5321 Procedure and treatment not carried out due to patient leaving prior to being seen by health care provider: Secondary | ICD-10-CM | POA: Diagnosis not present

## 2018-07-27 NOTE — ED Notes (Signed)
Pt left after triage, sts it was embarrassing to come to an emergency room for his symptoms in the first place. Advised pt to stay and be assessed, however he said he would rather leave.

## 2018-07-27 NOTE — ED Triage Notes (Signed)
Pt sts his right ear has a wax build up, he tried to flush it out with warm water and H2O2 no success. Sts his hearing is affected.

## 2019-03-13 ENCOUNTER — Ambulatory Visit: Payer: 59 | Attending: Internal Medicine

## 2019-03-13 DIAGNOSIS — Z20822 Contact with and (suspected) exposure to covid-19: Secondary | ICD-10-CM

## 2019-03-15 LAB — NOVEL CORONAVIRUS, NAA: SARS-CoV-2, NAA: NOT DETECTED

## 2019-06-21 IMAGING — DX RIGHT HAND - COMPLETE 3+ VIEW
3 series · 3 of 3 positions shown · non-contrast
Comparison: None.

CLINICAL DATA: Foreign body inserted pt's hand anteriorly across
the palm. Foreign body was Genichiro Magana, and patient was
able to retrieve all of it as far as he is aware. Appears that the
wrench went in just under the skin.

EXAM:
RIGHT HAND - COMPLETE 3+ VIEW

[hand ap]
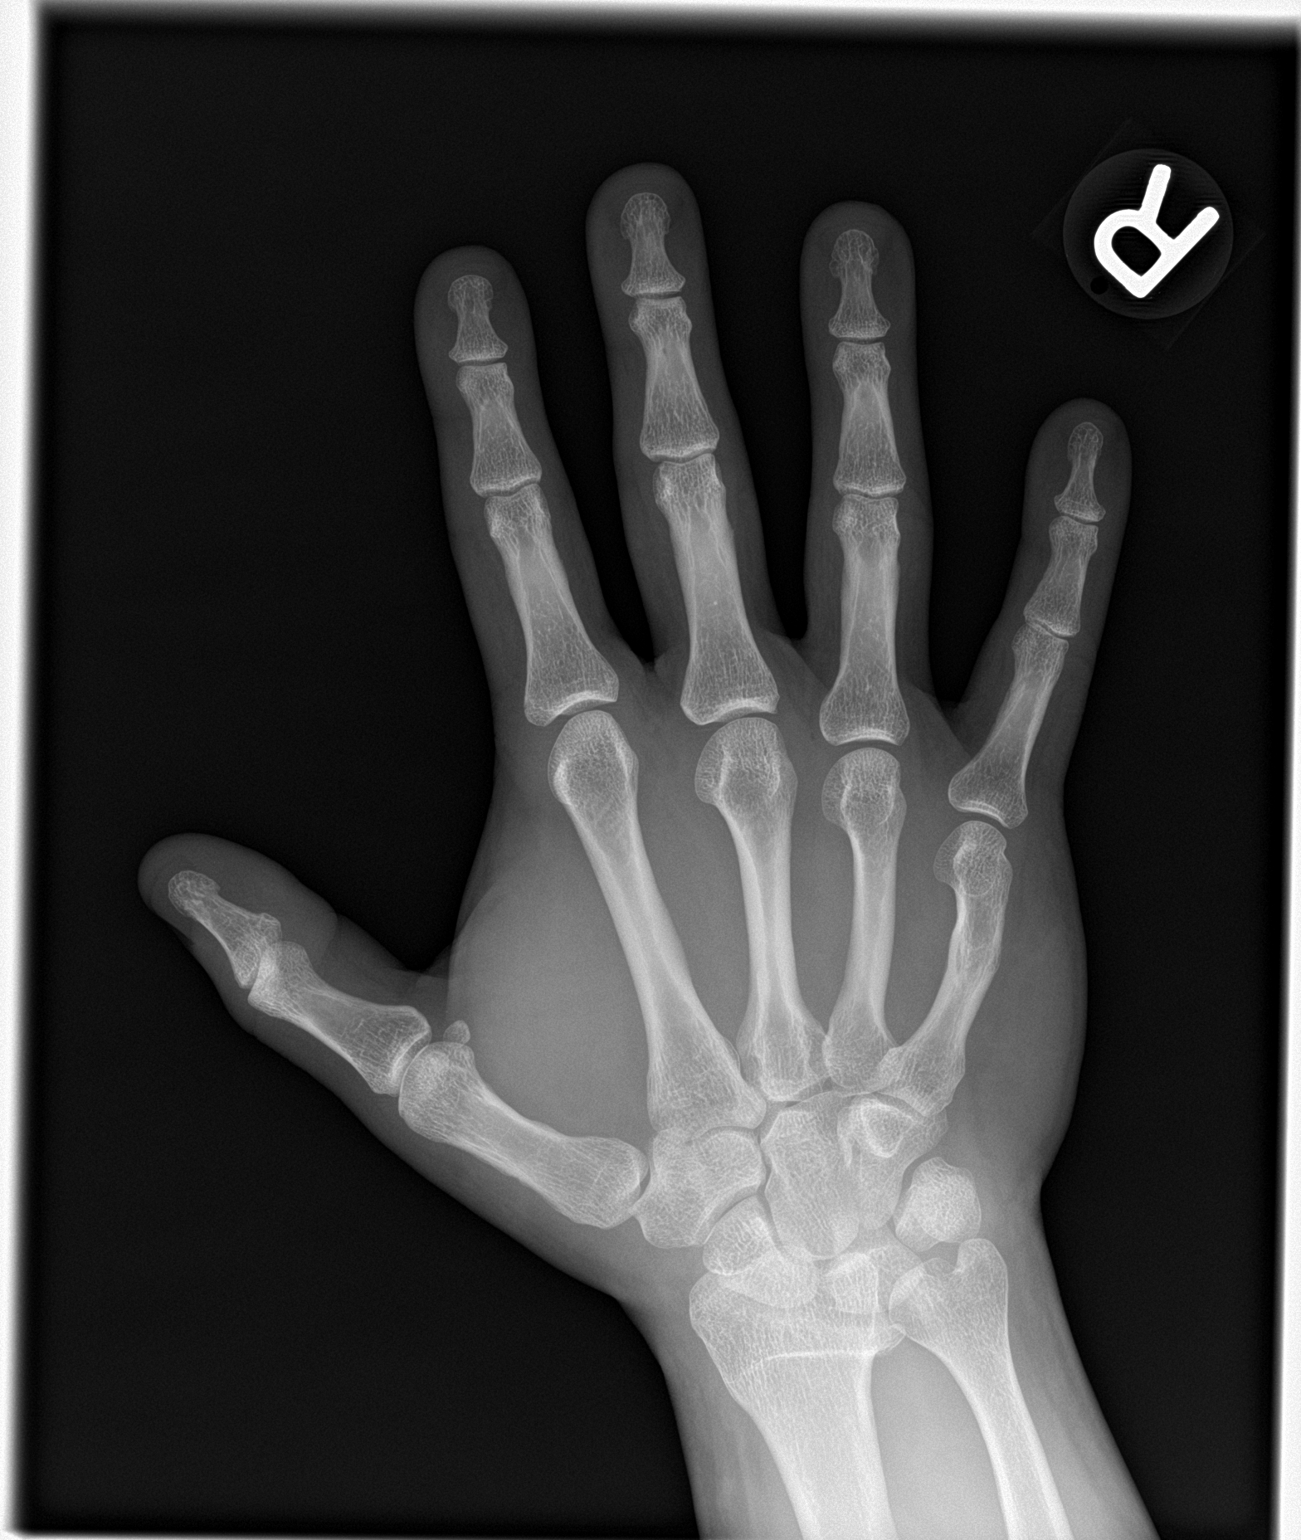

[hand obl]
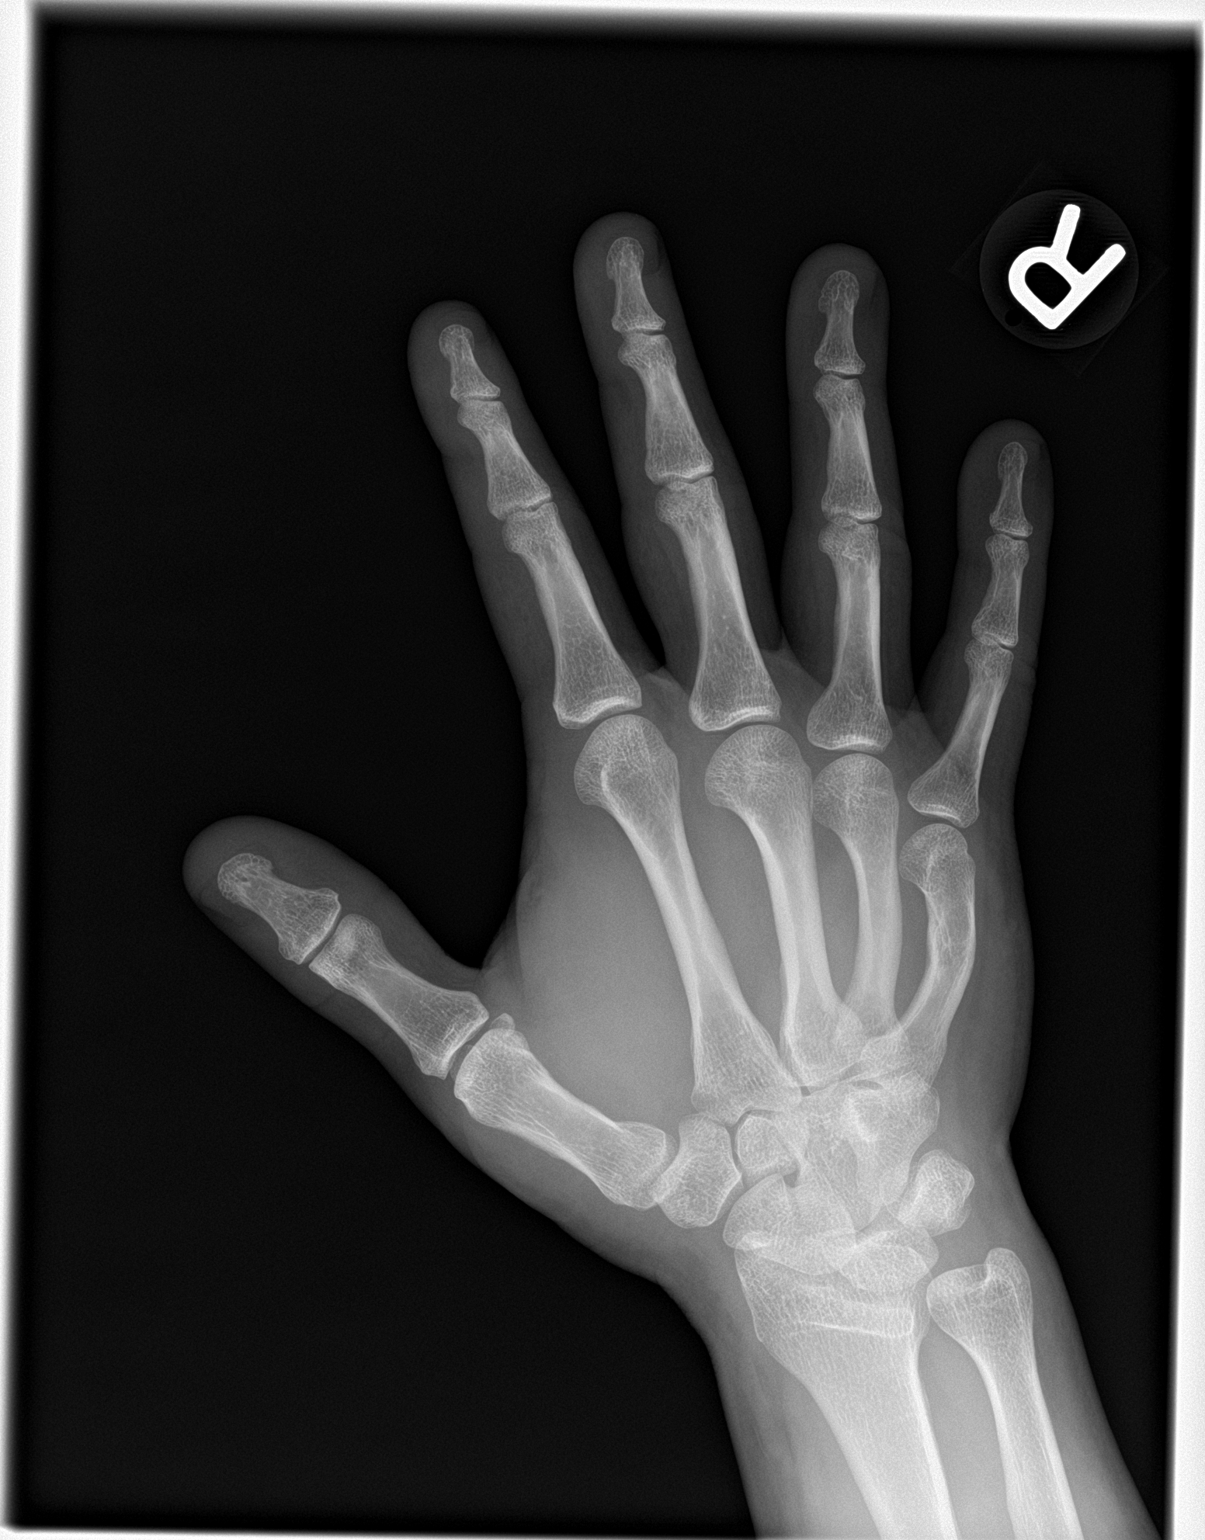

[hand lat]
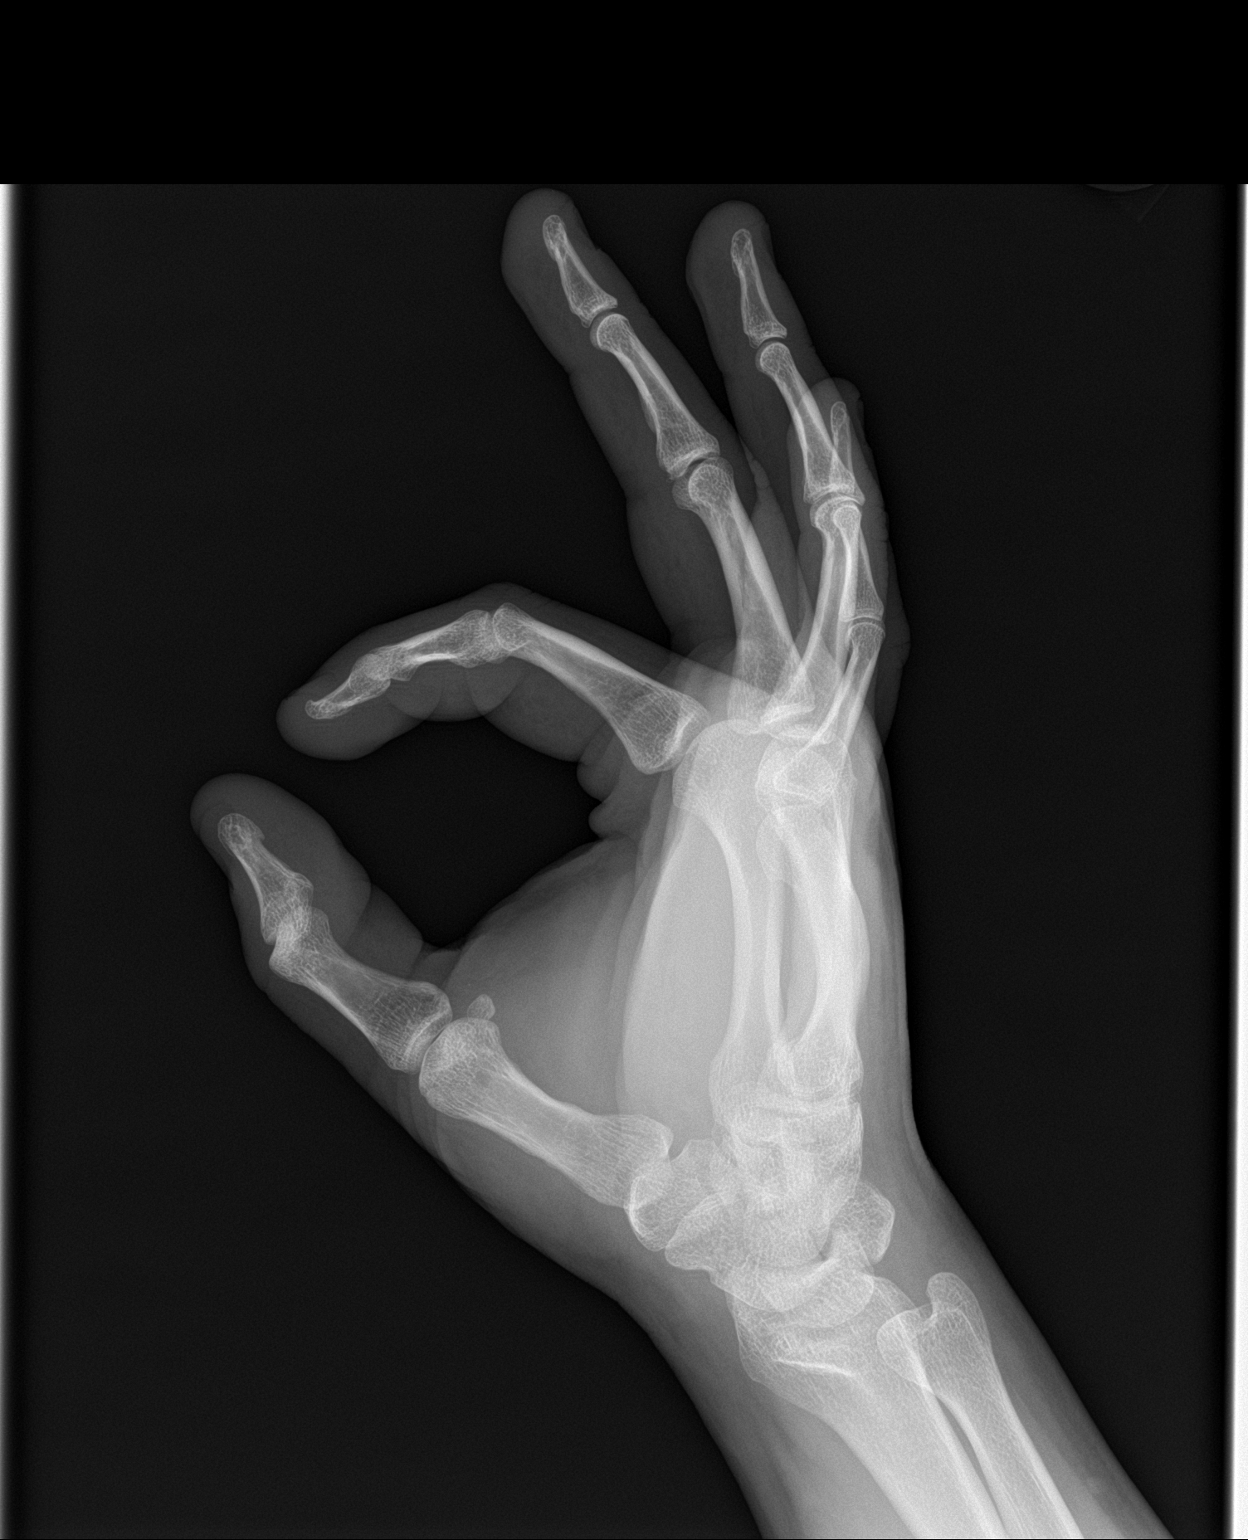

[3 of 3 positions shown; findings below may reference images not displayed]

FINDINGS: No acute fracture. Deformity of the fifth metacarpal is consistent
with an old, healed fracture.

No bone lesion.

Joints normally spaced and aligned.  No arthropathic changes.

No radiopaque foreign body.  No soft tissue air.
IMPRESSION: 1. No fracture, dislocation or radiopaque foreign body.

## 2020-10-01 ENCOUNTER — Encounter (HOSPITAL_BASED_OUTPATIENT_CLINIC_OR_DEPARTMENT_OTHER): Payer: Self-pay | Admitting: Emergency Medicine

## 2020-10-01 ENCOUNTER — Other Ambulatory Visit: Payer: Self-pay

## 2020-10-01 ENCOUNTER — Emergency Department (HOSPITAL_BASED_OUTPATIENT_CLINIC_OR_DEPARTMENT_OTHER)
Admission: EM | Admit: 2020-10-01 | Discharge: 2020-10-01 | Disposition: A | Discharge status: Home / Self Care | Payer: 59 | Attending: Emergency Medicine | Admitting: Emergency Medicine

## 2020-10-01 DIAGNOSIS — Z5321 Procedure and treatment not carried out due to patient leaving prior to being seen by health care provider: Secondary | ICD-10-CM | POA: Insufficient documentation

## 2020-10-01 DIAGNOSIS — U071 COVID-19: Secondary | ICD-10-CM | POA: Insufficient documentation

## 2020-10-01 NOTE — ED Triage Notes (Signed)
Pt reports headache, cough, body aches since yesterday. Pt reports took home covid test and had a positive result. Pt reports is here to confirm due to being out of work since yesterday. Pt reports is taking otc medication with no relief of symptoms.

## 2021-01-26 ENCOUNTER — Encounter (HOSPITAL_BASED_OUTPATIENT_CLINIC_OR_DEPARTMENT_OTHER): Payer: Self-pay | Admitting: Urology

## 2021-01-26 ENCOUNTER — Emergency Department (HOSPITAL_BASED_OUTPATIENT_CLINIC_OR_DEPARTMENT_OTHER)
Admission: EM | Admit: 2021-01-26 | Discharge: 2021-01-27 | Disposition: A | Discharge status: Home / Self Care | Payer: Self-pay | Attending: Emergency Medicine | Admitting: Emergency Medicine

## 2021-01-26 ENCOUNTER — Other Ambulatory Visit: Payer: Self-pay

## 2021-01-26 DIAGNOSIS — F17210 Nicotine dependence, cigarettes, uncomplicated: Secondary | ICD-10-CM | POA: Insufficient documentation

## 2021-01-26 DIAGNOSIS — W25XXXA Contact with sharp glass, initial encounter: Secondary | ICD-10-CM | POA: Insufficient documentation

## 2021-01-26 DIAGNOSIS — S91312A Laceration without foreign body, left foot, initial encounter: Secondary | ICD-10-CM | POA: Insufficient documentation

## 2021-01-26 DIAGNOSIS — Z23 Encounter for immunization: Secondary | ICD-10-CM | POA: Insufficient documentation

## 2021-01-26 MED ORDER — TETANUS-DIPHTH-ACELL PERTUSSIS 5-2.5-18.5 LF-MCG/0.5 IM SUSY
PREFILLED_SYRINGE | INTRAMUSCULAR | Status: AC
  Filled 2021-01-26: qty 0.5

## 2021-01-26 MED ORDER — TETANUS-DIPHTH-ACELL PERTUSSIS 5-2.5-18.5 LF-MCG/0.5 IM SUSY
0.5000 mL | PREFILLED_SYRINGE | Freq: Once | INTRAMUSCULAR | Status: AC
  Administered 2021-01-26: 23:00:00 0.5 mL via INTRAMUSCULAR

## 2021-01-26 NOTE — ED Provider Notes (Signed)
[facility] EMERGENCY DEPARTMENT Provider Note   CSN: [id number] Arrival date & time: 01/26/21  1651     History Chief Complaint  Patient presents with   Extremity Laceration    Matthew Leblanc is a 35 y.o. male.  HPI     This is a 35 year old male with a history of ADHD who presents with injury to the left foot.  Patient reports that he stepped on a picture.  The glass broke.  It created a laceration in the left foot.  This happened around 1 PM.  He has reported recurrent bleeding.  Unknown last tetanus shot.  No other known injury.  Does not believe he has retained last in the foot.  Minimal pain.  Past Medical History:  Diagnosis Date   ADD (attention deficit disorder) without hyperactivity    Back pain    Depression    Seasonal allergies     Patient Active Problem List   Diagnosis Date Noted   Hemoptysis 03/30/2013    Past Surgical History:  Procedure Laterality Date   chin surgery     baseball accident   LACERATION REPAIR     left knee       Family History  Problem Relation Age of Onset   Heart disease Maternal Grandmother    Hyperlipidemia Maternal Grandmother    Brain cancer Paternal Grandfather    Hypertension Mother     Social History   Tobacco Use   Smoking status: Every Day    Packs/day: 1.00    Years: 8.00    Pack years: 8.00    Types: Cigarettes   Smokeless tobacco: Never   Tobacco comments:    Pt is cutting back--currently smoking 1/4 PPD  Substance Use Topics   Alcohol use: Yes    Comment: social-rare   Drug use: No    Home Medications Prior to Admission medications   Medication Sig Start Date End Date Taking? Authorizing Provider  acetaminophen (TYLENOL) 500 MG tablet Take 500 mg by mouth every 6 (six) hours as needed for mild pain or headache.    [provider]  ARIPiprazole (ABILIFY) 10 MG tablet Take 20 mg by mouth daily.    [provider]  ibuprofen (ADVIL,MOTRIN) 800 MG tablet Take 800 mg by  mouth every 8 (eight) hours as needed for headache or moderate pain.    [provider]  naproxen (NAPROSYN) 250 MG tablet Take 500 mg by mouth 2 (two) times daily as needed for mild pain.    [provider]  neomycin-bacitracin-polymyxin (NEOSPORIN) ointment Apply 1 application topically every 12 (twelve) hours as needed for wound care.    [provider]  promethazine (PHENERGAN) 25 MG tablet Take 1 tablet (25 mg total) by mouth every 6 (six) hours as needed for nausea or vomiting. Patient not taking: No sig reported 12/11/13   Lurene Shadow, PA-C    Allergies    Patient has no active allergies.  Review of Systems   Review of Systems  Constitutional:  Negative for fever.  Skin:  Positive for wound. Negative for color change.  All other systems reviewed and are negative.  Physical Exam Updated Vital Signs BP 118/76 (BP Location: Right Arm)   Pulse 98   Temp 98.2 F (36.8 C) (Oral)   Resp 18   Ht 1.702 m (5\' 7" )   Wt 85.6 kg   SpO2 99%   BMI 29.56 kg/m   Physical Exam Vitals and nursing note reviewed.  Constitutional:  Appearance: He is well-developed. He is not ill-appearing.  HENT:     Head: Normocephalic and atraumatic.     Mouth/Throat:     Mouth: Mucous membranes are moist.  Eyes:     Pupils: Pupils are equal, round, and reactive to light.  Cardiovascular:     Rate and Rhythm: Normal rate and regular rhythm.  Pulmonary:     Effort: Pulmonary effort is normal. No respiratory distress.  Abdominal:     Palpations: Abdomen is soft.  Musculoskeletal:        General: No deformity.     Cervical back: Neck supple.  Lymphadenopathy:     Cervical: No cervical adenopathy.  Skin:    General: Skin is warm and dry.     Comments: 4 cm laceration between the first and second digits on the plantar aspect of the left toe, no significant gaping, no active bleeding, no palpable foreign body, no adjacent erythema  Neurological:     Mental Status:  He is alert and oriented to person, place, and time.  Psychiatric:        Mood and Affect: Mood normal.    ED Results / Procedures / Treatments   Labs (all labs ordered are listed, but only abnormal results are displayed) Labs Reviewed - No data to display  EKG None  Radiology No results found.  Procedures .Marland KitchenLaceration Repair  Date/Time: 01/26/2021 11:59 PM Performed by: Shon Baton, MD Authorized by: Shon Baton, MD   Consent:    Consent obtained:  Verbal   Consent given by:  Patient   Risks discussed:  Infection, pain and poor cosmetic result   Alternatives discussed:  No treatment Anesthesia:    Anesthesia method:  None Laceration details:    Location:  Foot   Foot location:  Sole of L foot   Length (cm):  4   Depth (mm):  3 Pre-procedure details:    Preparation:  Patient was prepped and draped in usual sterile fashion Exploration:    Imaging outcome: foreign body not noted     Wound exploration: wound explored through full range of motion and entire depth of wound visualized   Treatment:    Area cleansed with:  Povidone-iodine   Amount of cleaning:  Standard   Irrigation solution:  Sterile saline   Irrigation method:  Pressure wash   Debridement:  None   Undermining:  None Skin repair:    Repair method:  Steri-Strips and tissue adhesive   Number of Steri-Strips:  1 Approximation:    Approximation:  Close Repair type:    Repair type:  Simple Post-procedure details:    Dressing:  Open (no dressing)   Procedure completion:  Tolerated Comments:     Postop shoe provided   Medications Ordered in ED Medications  Tdap (BOOSTRIX) injection 0.5 mL (0.5 mLs Intramuscular Given 01/26/21 2328)    ED Course  I have reviewed the triage vital signs and the nursing notes.  Pertinent labs & imaging results that were available during my care of the patient were reviewed by me and considered in my medical decision making (see chart for details).     MDM Rules/Calculators/A&P                            Patient presents with an injury to the left foot.  Laceration sustained approximately 10 hours ago.  Tetanus was updated.  Laceration is not bleeding at this time.  It  is only slightly gaping.  We discussed options including wound adhesive versus a stitch to keep it from rebleeding.  Patient opted for wound adhesive.  Steri-Strip was applied in addition to Dermabond.  He was provided with a postop shoe.  No signs or symptoms of infection at this time.  No palpable foreign bodies.  Patient was given wound care instructions.  After history, exam, and medical workup I feel the patient has been appropriately medically screened and is safe for discharge home. Pertinent diagnoses were discussed with the patient. Patient was given return precautions.  Final Clinical Impression(s) / ED Diagnoses Final diagnoses:  Laceration of plantar aspect of foot, left, initial encounter    Rx / DC Orders ED Discharge Orders     None        Illana Nolting, Mayer Masker, MD 01/27/21 0001

## 2021-01-26 NOTE — ED Notes (Signed)
Pt care taken, has a small lac between the left great toe and the second one, happened at 1 pm. approximated

## 2021-01-26 NOTE — Discharge Instructions (Signed)
You were seen today for a laceration of your left foot.  It was glued.  Use postop shoe over the next few days to keep it from opening back up.  The glue and Steri-Strips should fall off on her own in approximately 1 week.

## 2021-01-26 NOTE — ED Triage Notes (Signed)
Laceration between toes on left foot, states stepped on glass.   Last Tetanus unknown Bleeding controlled

## 2023-01-24 ENCOUNTER — Encounter (HOSPITAL_BASED_OUTPATIENT_CLINIC_OR_DEPARTMENT_OTHER): Payer: Self-pay

## 2023-01-24 ENCOUNTER — Other Ambulatory Visit: Payer: Self-pay

## 2023-01-24 ENCOUNTER — Emergency Department (HOSPITAL_BASED_OUTPATIENT_CLINIC_OR_DEPARTMENT_OTHER): Payer: Self-pay

## 2023-01-24 ENCOUNTER — Emergency Department (HOSPITAL_BASED_OUTPATIENT_CLINIC_OR_DEPARTMENT_OTHER)
Admission: EM | Admit: 2023-01-24 | Discharge: 2023-01-24 | Disposition: A | Discharge status: Home / Self Care | Payer: Self-pay | Attending: Emergency Medicine | Admitting: Emergency Medicine

## 2023-01-24 DIAGNOSIS — K5732 Diverticulitis of large intestine without perforation or abscess without bleeding: Secondary | ICD-10-CM | POA: Insufficient documentation

## 2023-01-24 DIAGNOSIS — K5792 Diverticulitis of intestine, part unspecified, without perforation or abscess without bleeding: Secondary | ICD-10-CM

## 2023-01-24 LAB — CBC WITH DIFFERENTIAL/PLATELET
Abs Immature Granulocytes: 0.05 10*3/uL (ref 0.00–0.07)
Basophils Absolute: 0 10*3/uL (ref 0.0–0.1)
Basophils Relative: 0 %
Eosinophils Absolute: 0.2 10*3/uL (ref 0.0–0.5)
Eosinophils Relative: 1 %
HCT: 42.8 % (ref 39.0–52.0)
Hemoglobin: 15 g/dL (ref 13.0–17.0)
Immature Granulocytes: 0 %
Lymphocytes Relative: 20 %
Lymphs Abs: 3 10*3/uL (ref 0.7–4.0)
MCH: 29.4 pg (ref 26.0–34.0)
MCHC: 35 g/dL (ref 30.0–36.0)
MCV: 83.8 fL (ref 80.0–100.0)
Monocytes Absolute: 1.2 10*3/uL — ABNORMAL HIGH (ref 0.1–1.0)
Monocytes Relative: 8 %
Neutro Abs: 10.4 10*3/uL — ABNORMAL HIGH (ref 1.7–7.7)
Neutrophils Relative %: 71 %
Platelets: 294 10*3/uL (ref 150–400)
RBC: 5.11 MIL/uL (ref 4.22–5.81)
RDW: 12.3 % (ref 11.5–15.5)
WBC: 14.9 10*3/uL — ABNORMAL HIGH (ref 4.0–10.5)
nRBC: 0 % (ref 0.0–0.2)

## 2023-01-24 LAB — COMPREHENSIVE METABOLIC PANEL
ALT: 32 U/L (ref 0–44)
AST: 23 U/L (ref 15–41)
Albumin: 4.6 g/dL (ref 3.5–5.0)
Alkaline Phosphatase: 82 U/L (ref 38–126)
Anion gap: 9 (ref 5–15)
BUN: 16 mg/dL (ref 6–20)
CO2: 27 mmol/L (ref 22–32)
Calcium: 9.9 mg/dL (ref 8.9–10.3)
Chloride: 105 mmol/L (ref 98–111)
Creatinine, Ser: 1.16 mg/dL (ref 0.61–1.24)
GFR, Estimated: >60 mL/min (ref >60)
Glucose, Bld: 80 mg/dL (ref 70–99)
Potassium: 3.8 mmol/L (ref 3.5–5.1)
Sodium: 141 mmol/L (ref 135–145)
Total Bilirubin: 0.6 mg/dL (ref <1.2)
Total Protein: 7.1 g/dL (ref 6.5–8.1)

## 2023-01-24 LAB — URINALYSIS, ROUTINE W REFLEX MICROSCOPIC
Bilirubin Urine: NEGATIVE
Glucose, UA: NEGATIVE mg/dL
Hgb urine dipstick: NEGATIVE
Ketones, ur: NEGATIVE mg/dL
Leukocytes,Ua: NEGATIVE
Nitrite: NEGATIVE
Protein, ur: NEGATIVE mg/dL
Specific Gravity, Urine: 1.025 (ref 1.005–1.030)
pH: 7 (ref 5.0–8.0)

## 2023-01-24 LAB — LIPASE, BLOOD: Lipase: 16 U/L (ref 11–51)

## 2023-01-24 MED ORDER — AMOXICILLIN-POT CLAVULANATE 875-125 MG PO TABS: 1.0000 | ORAL_TABLET | Freq: Two times a day (BID) | ORAL | 0 refills | Status: AC

## 2023-01-24 MED ORDER — LACTULOSE 10 GM/15ML PO SOLN: 20.0000 g | Freq: Once | ORAL | Status: DC

## 2023-01-24 MED ORDER — IOHEXOL 300 MG/ML  SOLN
100.0000 mL | Freq: Once | INTRAMUSCULAR | Status: AC | PRN
  Administered 2023-01-24: 100 mL via INTRAVENOUS

## 2023-01-24 NOTE — ED Provider Notes (Signed)
[place] [facility]ER[facility]ENC[facility] DE[facility]MENT AT [facility] Provider Note   CSN: [id number] Arrival date & time: 01/24/23  1135     History  Chief Complaint  Patient presents with   Abdominal Pain    Matthew Leblanc is a 37 y.o. male with past medical history of gastric ulcer presenting to emergency room today with 3 days of lower abdominal pain worse on the left side.  Patient reports he is passing gas no nausea vomiting or diarrhea however he has not had normal bowel movements feels like he is constipated.  Patient has tried taking Bentyl and gasX for relief.  Patient reports he started a new diet approximately 1 week ago with increased protein and fiber reports he drinks lots of water daily.  Denies any abdominal surgeries in the past.  No blood noted in bowel movements or urine in urine.  No dysuria or penile discharge. Requesting medication for bowel movement.    Abdominal Pain      Home Medications Prior to Admission medications   Medication Sig Start Date End Date Taking? Authorizing Provider  acetaminophen (TYLENOL) 500 MG tablet Take 500 mg by mouth every 6 (six) hours as needed for mild pain or headache.    [provider]  ARIPiprazole (ABILIFY) 10 MG tablet Take 20 mg by mouth daily.    [provider]  ibuprofen (ADVIL,MOTRIN) 800 MG tablet Take 800 mg by mouth every 8 (eight) hours as needed for headache or moderate pain.    [provider]  naproxen (NAPROSYN) 250 MG tablet Take 500 mg by mouth 2 (two) times daily as needed for mild pain.    [provider]  neomycin-bacitracin-polymyxin (NEOSPORIN) ointment Apply 1 application topically every 12 (twelve) hours as needed for wound care.    [provider]  promethazine (PHENERGAN) 25 MG tablet Take 1 tablet (25 mg total) by mouth every 6 (six) hours as needed for nausea or vomiting. Patient not taking: No sig reported 12/11/13   Lurene Shadow, PA-C      Allergies    Patient  has no active allergies.    Review of Systems   Review of Systems  Gastrointestinal:  Positive for abdominal pain.    Physical Exam Updated Vital Signs BP (!) 141/90   Pulse 99   Temp 98.2 F (36.8 C) (Oral)   Resp 18   Wt 93.4 kg   SpO2 98%   BMI 32.26 kg/m  Physical Exam Vitals and nursing note reviewed.  Constitutional:      General: He is not in acute distress.    Appearance: He is not ill-appearing, toxic-appearing or diaphoretic.  HENT:     Head: Normocephalic and atraumatic.  Eyes:     General: No scleral icterus.    Conjunctiva/sclera: Conjunctivae normal.  Cardiovascular:     Rate and Rhythm: Normal rate and regular rhythm.     Pulses: Normal pulses.     Heart sounds: Normal heart sounds.  Pulmonary:     Effort: Pulmonary effort is normal. No respiratory distress.     Breath sounds: Normal breath sounds.  Abdominal:     General: Abdomen is flat. Bowel sounds are normal. There is no distension.     Palpations: Abdomen is soft. There is no mass.     Tenderness: There is abdominal tenderness.     Hernia: No hernia is present.     Comments: LLQ TTP  Musculoskeletal:     Right lower leg: No edema.  Left lower leg: No edema.  Skin:    General: Skin is warm and dry.     Capillary Refill: Capillary refill takes less than 2 seconds.     Findings: No lesion.  Neurological:     General: No focal deficit present.     Mental Status: He is alert and oriented to person, place, and time. Mental status is at baseline.     ED Results / Procedures / Treatments   Labs (all labs ordered are listed, but only abnormal results are displayed) Labs Reviewed  CBC WITH DIFFERENTIAL/PLATELET - Abnormal; Notable for the following components:      Result Value   WBC 14.9 (*)    Neutro Abs 10.4 (*)    Monocytes Absolute 1.2 (*)    All other components within normal limits  COMPREHENSIVE METABOLIC PANEL  LIPASE, BLOOD  URINALYSIS, ROUTINE W REFLEX MICROSCOPIC     EKG None  Radiology CT ABDOMEN PELVIS W CONTRAST  Result Date: 01/24/2023 CLINICAL DATA:  Left lower quadrant pain EXAM: CT ABDOMEN AND PELVIS WITH CONTRAST TECHNIQUE: Multidetector CT imaging of the abdomen and pelvis was performed using the standard protocol following bolus administration of intravenous contrast. RADIATION DOSE REDUCTION: This exam was performed according to the departmental dose-optimization program which includes automated exposure control, adjustment of the mA and/or kV according to patient size and/or use of iterative reconstruction technique. CONTRAST:  OMNIPAQUE IOHEXOL 300 MG/ML  SOLN COMPARISON:  12/25/2013 FINDINGS: Lower chest: No acute abnormality. Hepatobiliary: No focal liver abnormality is seen. No gallstones, gallbladder wall thickening, or biliary dilatation. Pancreas: Unremarkable. No pancreatic ductal dilatation or surrounding inflammatory changes. Spleen: Normal in size without focal abnormality. Adrenals/Urinary Tract: Unremarkable adrenal glands. Kidneys enhance symmetrically without focal lesion, stone, or hydronephrosis. Ureters are nondilated. Urinary bladder appears unremarkable for the degree of distention. Stomach/Bowel: Stomach within normal limits. No dilated loops of bowel. The appendix is fluid-filled and dilated measuring 1.5 cm in diameter with slight wall thickening (series 301, image 69). No periappendiceal fat stranding or fluid. Scattered colonic diverticulosis. Focally inflamed diverticulum of the mid descending colon with adjacent pericolonic fat stranding and trace fluid (series 301, image 51). Vascular/Lymphatic: No significant vascular findings are present. No enlarged abdominal or pelvic lymph nodes. Reproductive: Prostate is unremarkable. Other: No ascites or pneumoperitoneum. Small fat containing umbilical hernia. Musculoskeletal: No acute or significant osseous findings. IMPRESSION: 1. Acute uncomplicated diverticulitis of the mid  descending colon. 2. The appendix is fluid-filled and dilated measuring 1.5 cm in diameter with slight wall thickening. No periappendiceal fat stranding or fluid. This may represent an appendiceal mucocele. Early acute appendicitis is not excluded. Electronically Signed   By: Duanne Guess D.O.   On: 01/24/2023 16:20    Procedures Procedures    Medications Ordered in ED Medications - No data to display  ED Course/ Medical Decision Making/ A&P                                 Medical Decision Making Amount and/or Complexity of Data Reviewed Labs: ordered. Radiology: ordered.  Risk Prescription drug management.   Charis Cuozzo 37 y.o. presented today for abd pain. Working DDx includes, but not limited to, gastroenteritis, colitis, SBO, appendicitis, cholecystitis, hepatobiliary pathology, gastritis, PUD, ACS, dissection, pancreatitis, nephrolithiasis, AAA, UTI, pyelonephritis, testicular torsion.  R/o DDx: These are considered less likely than current impression due to history of present illness, physical exam, labs/imaging findings.  Review of prior external notes: None  Pmhx: None  Unique Tests and My Interpretation:  CBC with differential: Mild leukocytosis no anemia CMP: No electrolyte abnormality Lipase: wnl UA: wnl    Imaging:  CT Abd/Pelvis with contrast: evaluate for structural/surgical etiology of patients' severe abdominal pain.  Showing findings consistent with mild uncomplicated diverticulitis of descending colon as well as appendiceal dilation without periappendiceal fat stranding or abscess possible concern for acute appendicitis   Problem List / ED Course / Critical interventions / Medication management  Patient coming to emergency room with abdominal pain.  Abdominal pain has been ongoing for several days.  Patient tolerating p.o. intake well-appearing with no fevers or chills.   Medications offered however patient declined, discussed CT imaging, labs and  general surgery consult with patient.  Discussed that I do not feel patient's symptoms are consistent with acute appendicitis since she is tolerating p.o. intake no nausea vomiting decreased appetite he is also not having significant right lower quadrant tenderness -however he has some mild tenderness in this area.  Will treat diverticulitis and given strict return precautions if symptoms are to change.  Patient expresses understanding agrees to outpatient follow-up with general surgery Patients vitals assessed. Upon arrival patient is hemodynamically stable.  I have reviewed the patients home medicines and have made adjustments as needed   Consult: Consulted with Dr. Freida Busman general surgery in regards to CT finding.  Discussed patient presentation, labs and imaging.  Dr. Freida Busman agrees to interpret imaging and will give me a call back in regards to recommendation. After reviewing imaging Dr. Freida Busman feels CT finding concerning for appendicitis is likely appendiceal cyst.  Does not feel patient needs admitted nor observed recommends outpatient follow-up and treatment for diverticulitis.  Plan:  F/u w/ PCP in 2-3d to ensure resolution of sx.  Patient was given return precautions. Patient stable for discharge at this time.  Patient educated on current sx/dx and verbalized understanding of plan. Return to ER w/ new or worsening sx.          Final Clinical Impression(s) / ED Diagnoses Final diagnoses:  Acute diverticulitis    Rx / DC Orders ED Discharge Orders          Ordered    amoxicillin-clavulanate (AUGMENTIN) 875-125 MG tablet  Every 12 hours        01/24/23 1729              Jaylen Claude, Horald Chestnut, PA-C 01/24/23 1818    Sloan Leiter, DO 01/25/23 [phone]

## 2023-01-24 NOTE — Discharge Instructions (Addendum)
Maintain a clear liquid diet for the next 2 to 3 days then you may slowly advance her diet. Please take the antibiotics as directed. Contact a health care provider if: Your pain gets worse. Your pooping does not go back to normal. Your symptoms do not get better with treatment. Your symptoms get worse all of a sudden. You have a fever. You vomit more than one time. Your poop is bloody, black, or tarry.   2. The appendix is fluid-filled and dilated measuring 1.5 cm in  diameter with slight wall thickening. No periappendiceal fat  stranding or fluid. This may represent an appendiceal mucocele.  Early acute appendicitis is not excluded.  Follow up with Dr Freida Busman regarding CT findings.

## 2023-01-24 NOTE — ED Triage Notes (Addendum)
Pt reports lower abdominal x 3 days. Unable to have BM. Only passing very small hard stools. Denies issue with urination. Taking beano and gas relief. Pt reports started new diet last week Protein and increase greens
